# Patient Record
Sex: Female | Born: 1935 | Race: Black or African American | Hispanic: No | Marital: Married | Smoking: Never smoker
Health system: Southern US, Community
[De-identification: ages and names within clinical notes are randomized; demographics above are authoritative.]

## PROBLEM LIST (undated history)

## (undated) DIAGNOSIS — J45909 Unspecified asthma, uncomplicated: Secondary | ICD-10-CM

## (undated) DIAGNOSIS — E785 Hyperlipidemia, unspecified: Secondary | ICD-10-CM

## (undated) DIAGNOSIS — I10 Essential (primary) hypertension: Secondary | ICD-10-CM

## (undated) DIAGNOSIS — I5042 Chronic combined systolic (congestive) and diastolic (congestive) heart failure: Secondary | ICD-10-CM

## (undated) DIAGNOSIS — I214 Non-ST elevation (NSTEMI) myocardial infarction: Secondary | ICD-10-CM

## (undated) DIAGNOSIS — E079 Disorder of thyroid, unspecified: Secondary | ICD-10-CM

## (undated) DIAGNOSIS — I255 Ischemic cardiomyopathy: Secondary | ICD-10-CM

## (undated) DIAGNOSIS — G4700 Insomnia, unspecified: Secondary | ICD-10-CM

## (undated) DIAGNOSIS — I071 Rheumatic tricuspid insufficiency: Secondary | ICD-10-CM

## (undated) HISTORY — PX: TUBAL LIGATION: SHX77

---

## 2009-06-12 ENCOUNTER — Emergency Department (HOSPITAL_BASED_OUTPATIENT_CLINIC_OR_DEPARTMENT_OTHER): Admission: EM | Admit: 2009-06-12 | Discharge: 2009-06-12 | Payer: Self-pay | Admitting: Emergency Medicine

## 2009-06-12 ENCOUNTER — Ambulatory Visit: Payer: Self-pay | Admitting: Diagnostic Radiology

## 2012-10-16 ENCOUNTER — Other Ambulatory Visit: Payer: Self-pay | Admitting: Geriatric Medicine

## 2014-02-02 ENCOUNTER — Emergency Department (HOSPITAL_BASED_OUTPATIENT_CLINIC_OR_DEPARTMENT_OTHER): Payer: Medicare Other

## 2014-02-02 ENCOUNTER — Inpatient Hospital Stay (HOSPITAL_BASED_OUTPATIENT_CLINIC_OR_DEPARTMENT_OTHER)
Admission: EM | Admit: 2014-02-02 | Discharge: 2014-02-10 | DRG: 280 | Disposition: A | Discharge status: Skilled Nursing Facility | Payer: Medicare Other | Attending: Cardiovascular Disease | Admitting: Cardiovascular Disease

## 2014-02-02 ENCOUNTER — Encounter (HOSPITAL_BASED_OUTPATIENT_CLINIC_OR_DEPARTMENT_OTHER): Payer: Self-pay | Admitting: Emergency Medicine

## 2014-02-02 DIAGNOSIS — I472 Ventricular tachycardia: Secondary | ICD-10-CM | POA: Diagnosis not present

## 2014-02-02 DIAGNOSIS — F0390 Unspecified dementia without behavioral disturbance: Secondary | ICD-10-CM | POA: Diagnosis present

## 2014-02-02 DIAGNOSIS — J4489 Other specified chronic obstructive pulmonary disease: Secondary | ICD-10-CM | POA: Diagnosis present

## 2014-02-02 DIAGNOSIS — I509 Heart failure, unspecified: Secondary | ICD-10-CM

## 2014-02-02 DIAGNOSIS — I5021 Acute systolic (congestive) heart failure: Secondary | ICD-10-CM | POA: Diagnosis not present

## 2014-02-02 DIAGNOSIS — E876 Hypokalemia: Secondary | ICD-10-CM | POA: Diagnosis not present

## 2014-02-02 DIAGNOSIS — E785 Hyperlipidemia, unspecified: Secondary | ICD-10-CM | POA: Diagnosis present

## 2014-02-02 DIAGNOSIS — E039 Hypothyroidism, unspecified: Secondary | ICD-10-CM | POA: Diagnosis present

## 2014-02-02 DIAGNOSIS — F0391 Unspecified dementia with behavioral disturbance: Secondary | ICD-10-CM

## 2014-02-02 DIAGNOSIS — I214 Non-ST elevation (NSTEMI) myocardial infarction: Secondary | ICD-10-CM | POA: Diagnosis not present

## 2014-02-02 DIAGNOSIS — R9431 Abnormal electrocardiogram [ECG] [EKG]: Secondary | ICD-10-CM | POA: Diagnosis present

## 2014-02-02 DIAGNOSIS — I5022 Chronic systolic (congestive) heart failure: Secondary | ICD-10-CM | POA: Diagnosis present

## 2014-02-02 DIAGNOSIS — Z66 Do not resuscitate: Secondary | ICD-10-CM | POA: Diagnosis present

## 2014-02-02 DIAGNOSIS — Z8249 Family history of ischemic heart disease and other diseases of the circulatory system: Secondary | ICD-10-CM

## 2014-02-02 DIAGNOSIS — J449 Chronic obstructive pulmonary disease, unspecified: Secondary | ICD-10-CM | POA: Diagnosis present

## 2014-02-02 DIAGNOSIS — N390 Urinary tract infection, site not specified: Secondary | ICD-10-CM | POA: Diagnosis not present

## 2014-02-02 DIAGNOSIS — I10 Essential (primary) hypertension: Secondary | ICD-10-CM | POA: Diagnosis present

## 2014-02-02 DIAGNOSIS — R0602 Shortness of breath: Secondary | ICD-10-CM | POA: Diagnosis not present

## 2014-02-02 HISTORY — DX: Essential (primary) hypertension: I10

## 2014-02-02 HISTORY — DX: Ischemic cardiomyopathy: I25.5

## 2014-02-02 HISTORY — DX: Hyperlipidemia, unspecified: E78.5

## 2014-02-02 HISTORY — DX: Rheumatic tricuspid insufficiency: I07.1

## 2014-02-02 HISTORY — DX: Disorder of thyroid, unspecified: E07.9

## 2014-02-02 HISTORY — DX: Unspecified asthma, uncomplicated: J45.909

## 2014-02-02 HISTORY — DX: Chronic combined systolic (congestive) and diastolic (congestive) heart failure: I50.42

## 2014-02-02 HISTORY — DX: Non-ST elevation (NSTEMI) myocardial infarction: I21.4

## 2014-02-02 HISTORY — DX: Insomnia, unspecified: G47.00

## 2014-02-02 LAB — CBC WITH DIFFERENTIAL/PLATELET
BASOS ABS: 0 10*3/uL (ref 0.0–0.1)
BASOS PCT: 0 % (ref 0–1)
Eosinophils Absolute: 0 10*3/uL (ref 0.0–0.7)
Eosinophils Relative: 0 % (ref 0–5)
HCT: 33.3 % — ABNORMAL LOW (ref 36.0–46.0)
HEMOGLOBIN: 11.1 g/dL — AB (ref 12.0–15.0)
Lymphocytes Relative: 13 % (ref 12–46)
Lymphs Abs: 1.4 10*3/uL (ref 0.7–4.0)
MCH: 28.9 pg (ref 26.0–34.0)
MCHC: 33.3 g/dL (ref 30.0–36.0)
MCV: 86.7 fL (ref 78.0–100.0)
MONOS PCT: 7 % (ref 3–12)
Monocytes Absolute: 0.8 10*3/uL (ref 0.1–1.0)
NEUTROS ABS: 9.1 10*3/uL — AB (ref 1.7–7.7)
NEUTROS PCT: 80 % — AB (ref 43–77)
PLATELETS: 257 10*3/uL (ref 150–400)
RBC: 3.84 MIL/uL — AB (ref 3.87–5.11)
RDW: 14.9 % (ref 11.5–15.5)
WBC: 11.3 10*3/uL — AB (ref 4.0–10.5)

## 2014-02-02 NOTE — ED Provider Notes (Addendum)
CSN: [id number]     Arrival date & time 02/02/14  2223 History  This chart was scribed for [name] Contreras [name] Cords, MD by Gwenyth Ober, ED Scribe. This patient was seen in room MH09/MH09 and the patient's care was started at 11:08 PM.     Chief Complaint  Patient presents with  . Shortness of Breath   Patient is a 80 y.o. female presenting with shortness of breath. The history is provided by the patient, a relative and the spouse. No language interpreter was used.  Shortness of Breath Severity:  Moderate Onset quality:  Gradual Timing:  Intermittent Progression:  Worsening Chronicity:  Recurrent Context: not animal exposure   Relieved by:  Nothing Worsened by:  Nothing tried Ineffective treatments:  None tried Associated symptoms: chest pain   Associated symptoms: no diaphoresis, no fever and no wheezing   Risk factors: no recent surgery    HPI Comments: [name] Contreras is a 80 y.o. female who presents to the Emergency Department complaining of constant global weakness, SOB, and swelling in the legs. She states that pt needed help going to the bathroom and getting out of bed. Patient states the fatigue has been episodic but is worse in the past 10 days with breathing worse in the past 48 hours.  Has used her inhalers without relief. Pt has had a history of similar symptoms in the past, but they come and go regularly. Pt denies being on Coumadin.  Patient reports chest pain from time to time but does not endorse any in the past 24 hours.    PCP is Dr. Julio Contreras  Past Medical History  Diagnosis Date  . Asthma   . Hypertension   . Hyperlipemia   . Insomnia   . Thyroid disease    Past Surgical History  Procedure Laterality Date  . Tubal ligation     No family history on file. History  Substance Use Topics  . Smoking status: Never Smoker   . Smokeless tobacco: Not on file  . Alcohol Use: No   OB History   Grav Para Term Preterm Abortions TAB SAB Ect Mult Living                  Review of Systems  Constitutional: Positive for fatigue. Negative for fever and diaphoresis.  Respiratory: Positive for shortness of breath. Negative for wheezing.   Cardiovascular: Positive for chest pain and leg swelling. Negative for palpitations.  Neurological: Negative for dizziness, weakness and numbness.  All other systems reviewed and are negative.     Allergies  Review of patient's allergies indicates no known allergies.  Home Medications   Prior to Admission medications   Medication Sig Start Date End Date Taking? Authorizing Provider  ALPRAZolam Prudy Feeler) 0.5 MG tablet Take 0.5 mg by mouth at bedtime as needed for anxiety.   Yes Historical Provider, MD  amLODipine (NORVASC) 5 MG tablet Take 5 mg by mouth daily.   Yes Historical Provider, MD  levothyroxine (SYNTHROID, LEVOTHROID) 25 MCG tablet Take 25 mcg by mouth daily before breakfast.   Yes Historical Provider, MD  lisinopril (PRINIVIL,ZESTRIL) 10 MG tablet Take 10 mg by mouth daily.   Yes Historical Provider, MD  simvastatin (ZOCOR) 20 MG tablet Take 20 mg by mouth daily.   Yes Historical Provider, MD   BP 158/110  Pulse 113  Temp(Src) 98.1 F (36.7 C) (Oral)  Resp 20  Ht 5\' 5"  (1.651 m)  Wt 140 lb (63.504 kg)  BMI 23.30 kg/m2  SpO2  99% Physical Exam  Nursing note and vitals reviewed. Constitutional: She appears well-developed and well-nourished.  HENT:  Head: Normocephalic and atraumatic.  Mouth/Throat: Oropharynx is clear and moist. No oropharyngeal exudate.  Trachea midline  Eyes: EOM are normal. Pupils are equal, round, and reactive to light.  Neck: Normal range of motion. Neck supple.  Cardiovascular: Normal rate, regular rhythm, normal heart sounds and intact distal pulses.  Exam reveals no gallop and no friction rub.   No murmur heard. Brawny edema up to knees bilateral, symmetric   Pulmonary/Chest: Effort normal and breath sounds normal. No stridor. No respiratory distress. She has no wheezes. She  has no rales. She exhibits no tenderness.  No stridor, no bruits  Abdominal: Soft. Bowel sounds are normal. There is no tenderness. There is no rebound and no guarding.  Musculoskeletal: Normal range of motion. She exhibits edema. She exhibits no tenderness.  Lymphadenopathy:    She has no cervical adenopathy.  Neurological: She is alert. She has normal reflexes.  Skin: Skin is warm and dry. She is not diaphoretic.  Psychiatric: She has a normal mood and affect.    ED Course  Procedures (including critical care time) DIAGNOSTIC STUDIES: Oxygen Saturation is 99% on RA, normal by my interpretation.    COORDINATION OF CARE: 11:13 PM Discussed treatment plan with pt at bedside and pt agreed to plan.  Labs Review Labs Reviewed - No data to display  Imaging Review No results found.   EKG Interpretation None      MDM   Final diagnoses:  None    NSTEMI   1240 case d/w Dr. Leeann MustJacob kelly of cardiology who accept patient on behalf of Dr. Clifton JamesMcalhany, will admit to step down    Medications  aspirin chewable tablet 324 mg (324 mg Oral Given 02/03/14 0035)  heparin ADULT infusion 100 units/mL (25000 units/250 mL) (12 Units/kg/hr  63.5 kg Intravenous New Bag/Given 02/03/14 0035)    Date: 02/03/2014  Rate: 98  Rhythm: normal sinus rhythm  QRS Axis: normal  Intervals: QT prolonged  ST/T Wave abnormalities: Inverted T waves inferiorly and anteriolaterally consistent with ischemia  Conduction Disutrbances:none  Narrative Interpretation:   Old EKG Reviewed: none available     [name], MD 02/03/14 0047  [name], MD 02/03/14 [phone]

## 2014-02-02 NOTE — ED Notes (Signed)
C/o sob, increased confusion x 3 weeks  Lower ext swollen  Denies pain

## 2014-02-02 NOTE — ED Notes (Signed)
Daughter reports increased weakness and SOB. Increased confusion  x 3 weeks . Pt is out of albuterol neb.

## 2014-02-03 ENCOUNTER — Encounter (HOSPITAL_BASED_OUTPATIENT_CLINIC_OR_DEPARTMENT_OTHER): Payer: Self-pay | Admitting: Radiology

## 2014-02-03 DIAGNOSIS — E876 Hypokalemia: Secondary | ICD-10-CM | POA: Diagnosis not present

## 2014-02-03 DIAGNOSIS — E784 Other hyperlipidemia: Secondary | ICD-10-CM

## 2014-02-03 DIAGNOSIS — I5022 Chronic systolic (congestive) heart failure: Secondary | ICD-10-CM | POA: Diagnosis present

## 2014-02-03 DIAGNOSIS — I472 Ventricular tachycardia: Secondary | ICD-10-CM | POA: Diagnosis not present

## 2014-02-03 DIAGNOSIS — Z66 Do not resuscitate: Secondary | ICD-10-CM | POA: Diagnosis present

## 2014-02-03 DIAGNOSIS — N390 Urinary tract infection, site not specified: Secondary | ICD-10-CM | POA: Diagnosis not present

## 2014-02-03 DIAGNOSIS — I5021 Acute systolic (congestive) heart failure: Secondary | ICD-10-CM | POA: Diagnosis not present

## 2014-02-03 DIAGNOSIS — I10 Essential (primary) hypertension: Secondary | ICD-10-CM

## 2014-02-03 DIAGNOSIS — J449 Chronic obstructive pulmonary disease, unspecified: Secondary | ICD-10-CM | POA: Diagnosis present

## 2014-02-03 DIAGNOSIS — E785 Hyperlipidemia, unspecified: Secondary | ICD-10-CM | POA: Diagnosis present

## 2014-02-03 DIAGNOSIS — F0390 Unspecified dementia without behavioral disturbance: Secondary | ICD-10-CM | POA: Diagnosis present

## 2014-02-03 DIAGNOSIS — E039 Hypothyroidism, unspecified: Secondary | ICD-10-CM | POA: Diagnosis present

## 2014-02-03 DIAGNOSIS — I509 Heart failure, unspecified: Secondary | ICD-10-CM

## 2014-02-03 DIAGNOSIS — R9431 Abnormal electrocardiogram [ECG] [EKG]: Secondary | ICD-10-CM | POA: Diagnosis present

## 2014-02-03 DIAGNOSIS — Z8249 Family history of ischemic heart disease and other diseases of the circulatory system: Secondary | ICD-10-CM | POA: Diagnosis not present

## 2014-02-03 DIAGNOSIS — I214 Non-ST elevation (NSTEMI) myocardial infarction: Principal | ICD-10-CM

## 2014-02-03 DIAGNOSIS — R0602 Shortness of breath: Secondary | ICD-10-CM | POA: Diagnosis present

## 2014-02-03 LAB — LIPID PANEL
CHOL/HDL RATIO: 2.2 ratio
Cholesterol: 166 mg/dL (ref 0–200)
HDL: 76 mg/dL (ref >39)
LDL CALC: 73 mg/dL (ref 0–99)
Triglycerides: 83 mg/dL (ref <150)
VLDL: 17 mg/dL (ref 0–40)

## 2014-02-03 LAB — PROTIME-INR
INR: 1.34 (ref 0.00–1.49)
Prothrombin Time: 16.6 seconds — ABNORMAL HIGH (ref 11.6–15.2)

## 2014-02-03 LAB — COMPREHENSIVE METABOLIC PANEL
ALT: 19 U/L (ref 0–35)
ALT: 21 U/L (ref 0–35)
ANION GAP: 21 — AB (ref 5–15)
AST: 48 U/L — ABNORMAL HIGH (ref 0–37)
AST: 51 U/L — AB (ref 0–37)
Albumin: 3.3 g/dL — ABNORMAL LOW (ref 3.5–5.2)
Albumin: 3.6 g/dL (ref 3.5–5.2)
Alkaline Phosphatase: 143 U/L — ABNORMAL HIGH (ref 39–117)
Alkaline Phosphatase: 152 U/L — ABNORMAL HIGH (ref 39–117)
Anion gap: 18 — ABNORMAL HIGH (ref 5–15)
BILIRUBIN TOTAL: 0.5 mg/dL (ref 0.3–1.2)
BUN: 27 mg/dL — ABNORMAL HIGH (ref 6–23)
BUN: 27 mg/dL — ABNORMAL HIGH (ref 6–23)
CO2: 22 mEq/L (ref 19–32)
CO2: 22 meq/L (ref 19–32)
Calcium: 9.3 mg/dL (ref 8.4–10.5)
Calcium: 9.9 mg/dL (ref 8.4–10.5)
Chloride: 105 mEq/L (ref 96–112)
Chloride: 108 mEq/L (ref 96–112)
Creatinine, Ser: 1.29 mg/dL — ABNORMAL HIGH (ref 0.50–1.10)
Creatinine, Ser: 1.3 mg/dL — ABNORMAL HIGH (ref 0.50–1.10)
GFR calc Af Amer: 43 mL/min — ABNORMAL LOW (ref >90)
GFR calc non Af Amer: 37 mL/min — ABNORMAL LOW (ref >90)
GFR, EST AFRICAN AMERICAN: 42 mL/min — AB (ref >90)
GFR, EST NON AFRICAN AMERICAN: 36 mL/min — AB (ref >90)
GLUCOSE: 147 mg/dL — AB (ref 70–99)
Glucose, Bld: 120 mg/dL — ABNORMAL HIGH (ref 70–99)
Potassium: 3.8 mEq/L (ref 3.7–5.3)
Potassium: 4 mEq/L (ref 3.7–5.3)
SODIUM: 148 meq/L — AB (ref 137–147)
SODIUM: 148 meq/L — AB (ref 137–147)
Total Bilirubin: 0.3 mg/dL (ref 0.3–1.2)
Total Protein: 7.6 g/dL (ref 6.0–8.3)
Total Protein: 8.2 g/dL (ref 6.0–8.3)

## 2014-02-03 LAB — URINALYSIS, ROUTINE W REFLEX MICROSCOPIC
Bilirubin Urine: NEGATIVE
Glucose, UA: NEGATIVE mg/dL
Ketones, ur: 15 mg/dL — AB
NITRITE: NEGATIVE
PH: 5 (ref 5.0–8.0)
Protein, ur: 100 mg/dL — AB
SPECIFIC GRAVITY, URINE: 1.014 (ref 1.005–1.030)
UROBILINOGEN UA: 0.2 mg/dL (ref 0.0–1.0)

## 2014-02-03 LAB — CBC WITH DIFFERENTIAL/PLATELET
Basophils Absolute: 0 10*3/uL (ref 0.0–0.1)
Basophils Relative: 0 % (ref 0–1)
EOS ABS: 0 10*3/uL (ref 0.0–0.7)
EOS PCT: 0 % (ref 0–5)
HCT: 32.7 % — ABNORMAL LOW (ref 36.0–46.0)
HEMOGLOBIN: 10.8 g/dL — AB (ref 12.0–15.0)
Lymphocytes Relative: 17 % (ref 12–46)
Lymphs Abs: 2.1 10*3/uL (ref 0.7–4.0)
MCH: 28.4 pg (ref 26.0–34.0)
MCHC: 33 g/dL (ref 30.0–36.0)
MCV: 86.1 fL (ref 78.0–100.0)
MONOS PCT: 8 % (ref 3–12)
Monocytes Absolute: 1 10*3/uL (ref 0.1–1.0)
Neutro Abs: 9.5 10*3/uL — ABNORMAL HIGH (ref 1.7–7.7)
Neutrophils Relative %: 75 % (ref 43–77)
PLATELETS: 270 10*3/uL (ref 150–400)
RBC: 3.8 MIL/uL — AB (ref 3.87–5.11)
RDW: 15.2 % (ref 11.5–15.5)
WBC: 12.7 10*3/uL — ABNORMAL HIGH (ref 4.0–10.5)

## 2014-02-03 LAB — CBC
HCT: 36.2 % (ref 36.0–46.0)
HEMATOCRIT: 32.8 % — AB (ref 36.0–46.0)
Hemoglobin: 11.4 g/dL — ABNORMAL LOW (ref 12.0–15.0)
Hemoglobin: 10.8 g/dL — ABNORMAL LOW (ref 12.0–15.0)
MCH: 28.1 pg (ref 26.0–34.0)
MCH: 28.1 pg (ref 26.0–34.0)
MCHC: 31.5 g/dL (ref 30.0–36.0)
MCHC: 32.9 g/dL (ref 30.0–36.0)
MCV: 89.4 fL (ref 78.0–100.0)
MCV: 85.4 fL (ref 78.0–100.0)
Platelets: 249 10*3/uL (ref 150–400)
Platelets: 263 10*3/uL (ref 150–400)
RBC: 4.05 MIL/uL (ref 3.87–5.11)
RBC: 3.84 MIL/uL — ABNORMAL LOW (ref 3.87–5.11)
RDW: 15.7 % — AB (ref 11.5–15.5)
RDW: 15.4 % (ref 11.5–15.5)
WBC: 11.5 10*3/uL — ABNORMAL HIGH (ref 4.0–10.5)
WBC: 12.1 10*3/uL — AB (ref 4.0–10.5)

## 2014-02-03 LAB — PRO B NATRIURETIC PEPTIDE
PRO B NATRI PEPTIDE: 34552 pg/mL — AB (ref 0–450)
Pro B Natriuretic peptide (BNP): 33063 pg/mL — ABNORMAL HIGH (ref 0–450)

## 2014-02-03 LAB — PLATELET INHIBITION P2Y12: Platelet Function  P2Y12: 285 [PRU] (ref 194–418)

## 2014-02-03 LAB — BASIC METABOLIC PANEL
Anion gap: 18 — ABNORMAL HIGH (ref 5–15)
BUN: 27 mg/dL — AB (ref 6–23)
CHLORIDE: 104 meq/L (ref 96–112)
CO2: 26 mEq/L (ref 19–32)
Calcium: 9.3 mg/dL (ref 8.4–10.5)
Creatinine, Ser: 1.29 mg/dL — ABNORMAL HIGH (ref 0.50–1.10)
GFR, EST AFRICAN AMERICAN: 43 mL/min — AB (ref >90)
GFR, EST NON AFRICAN AMERICAN: 37 mL/min — AB (ref >90)
Glucose, Bld: 142 mg/dL — ABNORMAL HIGH (ref 70–99)
Potassium: 3.3 mEq/L — ABNORMAL LOW (ref 3.7–5.3)
Sodium: 148 mEq/L — ABNORMAL HIGH (ref 137–147)

## 2014-02-03 LAB — MRSA PCR SCREENING: MRSA BY PCR: NEGATIVE

## 2014-02-03 LAB — HEMOGLOBIN A1C
HEMOGLOBIN A1C: 6 % — AB (ref <5.7)
Mean Plasma Glucose: 126 mg/dL — ABNORMAL HIGH (ref <117)

## 2014-02-03 LAB — TROPONIN I
TROPONIN I: 1.06 ng/mL — AB (ref <0.30)
TROPONIN I: 1.69 ng/mL — AB (ref <0.30)
TROPONIN I: 4.09 ng/mL — AB (ref <0.30)
Troponin I: 2.08 ng/mL (ref <0.30)

## 2014-02-03 LAB — URINE MICROSCOPIC-ADD ON

## 2014-02-03 LAB — HEPARIN LEVEL (UNFRACTIONATED): Heparin Unfractionated: 0.42 IU/mL (ref 0.30–0.70)

## 2014-02-03 LAB — MAGNESIUM: MAGNESIUM: 1.9 mg/dL (ref 1.5–2.5)

## 2014-02-03 LAB — TSH: TSH: 3.29 u[IU]/mL (ref 0.350–4.500)

## 2014-02-03 LAB — I-STAT CG4 LACTIC ACID, ED: LACTIC ACID, VENOUS: 1.78 mmol/L (ref 0.5–2.2)

## 2014-02-03 MED ORDER — POTASSIUM CHLORIDE ER 10 MEQ PO TBCR
40.0000 meq | EXTENDED_RELEASE_TABLET | Freq: Once | ORAL | Status: AC
  Administered 2014-02-03: 23:00:00 10 meq via ORAL
  Filled 2014-02-03: qty 4

## 2014-02-03 MED ORDER — FUROSEMIDE 10 MG/ML IJ SOLN
40.0000 mg | Freq: Once | INTRAMUSCULAR | Status: AC
  Administered 2014-02-03: 40 mg via INTRAVENOUS
  Filled 2014-02-03: qty 4

## 2014-02-03 MED ORDER — SODIUM CHLORIDE 0.9 % IV SOLN: 250.0000 mL | INTRAVENOUS | Status: DC | PRN

## 2014-02-03 MED ORDER — ATORVASTATIN CALCIUM 80 MG PO TABS
80.0000 mg | ORAL_TABLET | Freq: Every day | ORAL | Status: DC
  Administered 2014-02-03 – 2014-02-09 (×6): 80 mg via ORAL
  Filled 2014-02-03 (×9): qty 1

## 2014-02-03 MED ORDER — ACETAMINOPHEN 325 MG PO TABS
650.0000 mg | ORAL_TABLET | ORAL | Status: DC | PRN
  Administered 2014-02-05 – 2014-02-06 (×3): 650 mg via ORAL
  Filled 2014-02-03 (×3): qty 2

## 2014-02-03 MED ORDER — SODIUM CHLORIDE 0.9 % IV SOLN: INTRAVENOUS | Status: DC

## 2014-02-03 MED ORDER — NITROGLYCERIN 0.4 MG SL SUBL
0.4000 mg | SUBLINGUAL_TABLET | SUBLINGUAL | Status: DC | PRN
  Filled 2014-02-03: qty 1

## 2014-02-03 MED ORDER — CETYLPYRIDINIUM CHLORIDE 0.05 % MT LIQD
7.0000 mL | Freq: Two times a day (BID) | OROMUCOSAL | Status: DC
  Administered 2014-02-04 – 2014-02-10 (×11): 7 mL via OROMUCOSAL

## 2014-02-03 MED ORDER — LISINOPRIL 10 MG PO TABS
10.0000 mg | ORAL_TABLET | Freq: Every day | ORAL | Status: DC
  Administered 2014-02-03 – 2014-02-04 (×2): 10 mg via ORAL
  Filled 2014-02-03 (×2): qty 1

## 2014-02-03 MED ORDER — ASPIRIN 81 MG PO CHEW
81.0000 mg | CHEWABLE_TABLET | ORAL | Status: DC
  Filled 2014-02-03: qty 1

## 2014-02-03 MED ORDER — ASPIRIN 300 MG RE SUPP: 300.0000 mg | RECTAL | Status: DC

## 2014-02-03 MED ORDER — ALPRAZOLAM 0.25 MG PO TABS
0.2500 mg | ORAL_TABLET | Freq: Once | ORAL | Status: AC
  Administered 2014-02-05: 0.25 mg via ORAL
  Filled 2014-02-03 (×2): qty 1

## 2014-02-03 MED ORDER — SODIUM CHLORIDE 0.9 % IJ SOLN: 3.0000 mL | INTRAMUSCULAR | Status: DC | PRN

## 2014-02-03 MED ORDER — ASPIRIN EC 81 MG PO TBEC
81.0000 mg | DELAYED_RELEASE_TABLET | Freq: Every day | ORAL | Status: DC
  Administered 2014-02-05 – 2014-02-10 (×6): 81 mg via ORAL
  Filled 2014-02-03 (×6): qty 1

## 2014-02-03 MED ORDER — FUROSEMIDE 10 MG/ML IJ SOLN
40.0000 mg | Freq: Two times a day (BID) | INTRAMUSCULAR | Status: DC
  Administered 2014-02-03 – 2014-02-04 (×2): 40 mg via INTRAVENOUS
  Filled 2014-02-03 (×3): qty 4

## 2014-02-03 MED ORDER — ALBUTEROL SULFATE (2.5 MG/3ML) 0.083% IN NEBU
INHALATION_SOLUTION | RESPIRATORY_TRACT | Status: AC
  Filled 2014-02-03: qty 3

## 2014-02-03 MED ORDER — METOPROLOL TARTRATE 12.5 MG HALF TABLET
12.5000 mg | ORAL_TABLET | Freq: Two times a day (BID) | ORAL | Status: DC
  Administered 2014-02-03 – 2014-02-04 (×4): 12.5 mg via ORAL
  Filled 2014-02-03 (×6): qty 1

## 2014-02-03 MED ORDER — SODIUM CHLORIDE 0.9 % IJ SOLN
3.0000 mL | Freq: Two times a day (BID) | INTRAMUSCULAR | Status: DC
Start: 2014-02-03 — End: 2014-02-03

## 2014-02-03 MED ORDER — ASPIRIN EC 81 MG PO TBEC: 81.0000 mg | DELAYED_RELEASE_TABLET | Freq: Every day | ORAL | Status: DC

## 2014-02-03 MED ORDER — IPRATROPIUM BROMIDE 0.02 % IN SOLN
0.5000 mg | Freq: Four times a day (QID) | RESPIRATORY_TRACT | Status: DC
  Administered 2014-02-03 (×3): 0.5 mg via RESPIRATORY_TRACT
  Filled 2014-02-03 (×2): qty 2.5

## 2014-02-03 MED ORDER — SODIUM CHLORIDE 0.9 % IV SOLN
Freq: Once | INTRAVENOUS | Status: AC
  Administered 2014-02-03: 500 mL via INTRAVENOUS

## 2014-02-03 MED ORDER — HEPARIN (PORCINE) IN NACL 100-0.45 UNIT/ML-% IJ SOLN
950.0000 [IU]/h | INTRAMUSCULAR | Status: DC
  Administered 2014-02-03 – 2014-02-06 (×4): 950 [IU]/h via INTRAVENOUS
  Filled 2014-02-03 (×5): qty 250

## 2014-02-03 MED ORDER — ALBUTEROL SULFATE (2.5 MG/3ML) 0.083% IN NEBU
2.5000 mg | INHALATION_SOLUTION | Freq: Four times a day (QID) | RESPIRATORY_TRACT | Status: DC
  Administered 2014-02-03 (×3): 2.5 mg via RESPIRATORY_TRACT
  Filled 2014-02-03 (×2): qty 3

## 2014-02-03 MED ORDER — LORAZEPAM 2 MG/ML IJ SOLN
0.5000 mg | Freq: Once | INTRAMUSCULAR | Status: AC
  Administered 2014-02-03: 0.5 mg via INTRAVENOUS
  Filled 2014-02-03: qty 1

## 2014-02-03 MED ORDER — SODIUM CHLORIDE 0.9 % IJ SOLN: 3.0000 mL | Freq: Two times a day (BID) | INTRAMUSCULAR | Status: DC

## 2014-02-03 MED ORDER — HEPARIN (PORCINE) IN NACL 100-0.45 UNIT/ML-% IJ SOLN
12.0000 [IU]/kg/h | Freq: Once | INTRAMUSCULAR | Status: AC
  Administered 2014-02-03: 12 [IU]/kg/h via INTRAVENOUS
  Filled 2014-02-03: qty 250

## 2014-02-03 MED ORDER — HEPARIN (PORCINE) IN NACL 100-0.45 UNIT/ML-% IJ SOLN
750.0000 [IU]/h | Freq: Once | INTRAMUSCULAR | Status: DC
  Filled 2014-02-03: qty 250

## 2014-02-03 MED ORDER — LEVOTHYROXINE SODIUM 25 MCG PO TABS
25.0000 ug | ORAL_TABLET | Freq: Every day | ORAL | Status: DC
  Administered 2014-02-03 – 2014-02-10 (×8): 25 ug via ORAL
  Filled 2014-02-03 (×9): qty 1

## 2014-02-03 MED ORDER — IPRATROPIUM BROMIDE 0.02 % IN SOLN
RESPIRATORY_TRACT | Status: AC
  Filled 2014-02-03: qty 2.5

## 2014-02-03 MED ORDER — ONDANSETRON HCL 4 MG/2ML IJ SOLN
4.0000 mg | Freq: Four times a day (QID) | INTRAMUSCULAR | Status: DC | PRN
  Administered 2014-02-08: 4 mg via INTRAVENOUS
  Filled 2014-02-03: qty 2

## 2014-02-03 MED ORDER — ALPRAZOLAM 0.5 MG PO TABS
0.5000 mg | ORAL_TABLET | Freq: Every evening | ORAL | Status: DC | PRN
  Administered 2014-02-03 – 2014-02-05 (×2): 0.5 mg via ORAL
  Filled 2014-02-03 (×2): qty 1

## 2014-02-03 MED ORDER — ASPIRIN 81 MG PO CHEW
324.0000 mg | CHEWABLE_TABLET | Freq: Once | ORAL | Status: AC
  Administered 2014-02-03: 324 mg via ORAL
  Filled 2014-02-03: qty 4

## 2014-02-03 MED ORDER — ASPIRIN 81 MG PO CHEW: 324.0000 mg | CHEWABLE_TABLET | ORAL | Status: DC

## 2014-02-03 MED ORDER — AMLODIPINE BESYLATE 5 MG PO TABS
5.0000 mg | ORAL_TABLET | Freq: Every day | ORAL | Status: DC
  Administered 2014-02-03 – 2014-02-09 (×7): 5 mg via ORAL
  Filled 2014-02-03 (×8): qty 1

## 2014-02-03 NOTE — Progress Notes (Signed)
CRITICAL VALUE ALERT  Critical value received:  Troponin 1.69  Date of notification:  02/03/14  Time of notification:  1000  Critical value read back:Yes.    Nurse who received alert:  Donna ChristenJim Arta Stump, RN  MD notified (1st page):  Wilburt FinlayHager, Bryan, PA  Time of first page:  1000; verbal notification  MD notified (2nd page):  Time of second page:  Responding MD:  Wilburt FinlayHager, Bryan, PA; verbal acknowledgement  Time MD responded:  1000

## 2014-02-03 NOTE — Progress Notes (Signed)
ANTICOAGULATION CONSULT NOTE - Follow Up Consult  Pharmacy Consult for heparin Indication: NSTEMI  No Known Allergies  Patient Measurements: Height: 5\' 5"  (165.1 cm) Weight: 156 lb 1.4 oz (70.8 kg) IBW/kg (Calculated) : 57  Vital Signs: Temp: 97.8 F (36.6 C) (10/01 0233) Temp src: Oral (10/01 0233) BP: 155/110 mmHg (10/01 0233) Pulse Rate: 103 (10/01 0233)  Labs:  Recent Labs  02/02/14 2330 02/03/14 0345 02/03/14 0954  HGB 11.1* 10.8* 11.4*  HCT 33.3* 32.7* 36.2  PLT 257 270 249  LABPROT  --  16.6*  --   INR  --  1.34  --   HEPARINUNFRC  --   --  <0.10*  CREATININE 1.30* 1.29*  --   TROPONINI 4.09*  --  1.69*    Estimated Creatinine Clearance: 31.5 ml/min (by C-G formula based on Cr of 1.29).   Medications:  Prescriptions prior to admission  Medication Sig Dispense Refill  . ALPRAZolam (XANAX) 0.5 MG tablet Take 0.5 mg by mouth at bedtime as needed for anxiety.      Marland Kitchen. amLODipine (NORVASC) 5 MG tablet Take 5 mg by mouth daily.      Marland Kitchen. aspirin EC 81 MG tablet Take 81 mg by mouth daily.      Marland Kitchen. ipratropium-albuterol (DUONEB) 0.5-2.5 (3) MG/3ML SOLN Take 3 mLs by nebulization every 6 (six) hours as needed.      Marland Kitchen. levothyroxine (SYNTHROID, LEVOTHROID) 25 MCG tablet Take 25 mcg by mouth daily before breakfast.      . lisinopril (PRINIVIL,ZESTRIL) 10 MG tablet Take 10 mg by mouth daily.      Marland Kitchen. PROAIR HFA 108 (90 BASE) MCG/ACT inhaler Inhale 2 puffs into the lungs every 4 (four) hours as needed.      . simvastatin (ZOCOR) 20 MG tablet Take 20 mg by mouth daily.       Scheduled:  . albuterol  2.5 mg Nebulization Q6H  . albuterol      . amLODipine  5 mg Oral Daily  . [START ON 02/04/2014] aspirin EC  81 mg Oral Daily  . atorvastatin  80 mg Oral q1800  . ipratropium      . ipratropium  0.5 mg Nebulization Q6H  . levothyroxine  25 mcg Oral QAC breakfast  . lisinopril  10 mg Oral Daily  . metoprolol tartrate  12.5 mg Oral BID  . sodium chloride  3 mL Intravenous Q12H     Assessment: 80[facility] and SOB, found with elevated troponin. Continue on IV heparin, heparin level < 0.1 this morning. Cbc stable, no issues with infusion per RN. Likely cath tomorrow  Goal of Therapy:  Heparin level 0.3-0.7 units/ml Monitor platelets by anticoagulation protocol: Yes   Plan:  Increased heparin infusion to 950 units/hr F/u heparin level at 1930 Daily heparin and CBC  Bayard HuggerMei Morena Mckissack, PharmD, BCPS  Clinical Pharmacist  Pager: [phone]  02/03/2014,1:08 PM

## 2014-02-03 NOTE — Progress Notes (Signed)
Tele shows irregular rhythm, alternates between SR and possibly a-fib.  Already on heparin gtt IV. Becomes more irregular and HR up to 140 briefly [facility]  EKG done, shows SR w/ premature supraventricular complexes and long QTc which MD is aware of according to progress notes.  Very weak, requires 2 assist to use BSC but refuses bedpan. Pt restless, confused and easily agitated.  Reportedly much calmer when family is here but all have left for the night.  Safety sitter remains in room.  Will continue to monitor.

## 2014-02-03 NOTE — Progress Notes (Signed)
ANTICOAGULATION CONSULT NOTE - Follow Up Consult  Pharmacy Consult for heparin Indication: NSTEMI  No Known Allergies  Patient Measurements: Height: 5\' 5"  (165.1 cm) Weight: 156 lb 1.4 oz (70.8 kg) IBW/kg (Calculated) : 57  Vital Signs: Temp: 97.8 F (36.6 C) (10/01 0233) Temp src: Oral (10/01 0233) BP: 155/110 mmHg (10/01 0233) Pulse Rate: 103 (10/01 0233)  Labs:  Recent Labs  02/02/14 2330  HGB 11.1*  HCT 33.3*  PLT 257  CREATININE 1.30*  TROPONINI 4.09*    Estimated Creatinine Clearance: 31.2 ml/min (by C-G formula based on Cr of 1.3).   Medications:  Prescriptions prior to admission  Medication Sig Dispense Refill  . ALPRAZolam (XANAX) 0.5 MG tablet Take 0.5 mg by mouth at bedtime as needed for anxiety.      Marland Kitchen. amLODipine (NORVASC) 5 MG tablet Take 5 mg by mouth daily.      Marland Kitchen. levothyroxine (SYNTHROID, LEVOTHROID) 25 MCG tablet Take 25 mcg by mouth daily before breakfast.      . lisinopril (PRINIVIL,ZESTRIL) 10 MG tablet Take 10 mg by mouth daily.      . simvastatin (ZOCOR) 20 MG tablet Take 20 mg by mouth daily.       Scheduled:  . amLODipine  5 mg Oral Daily  . [COMPLETED] aspirin  324 mg Oral NOW   Or  . [COMPLETED] aspirin  300 mg Rectal NOW  . [START ON 02/04/2014] aspirin EC  81 mg Oral Daily  . atorvastatin  80 mg Oral q1800  . heparin  750 Units/hr Intravenous Once  . levothyroxine  25 mcg Oral QAC breakfast  . lisinopril  10 mg Oral Daily  . metoprolol tartrate  12.5 mg Oral BID  . sodium chloride  3 mL Intravenous Q12H    Assessment: 80[facility] and [facility], found with elevated troponin, to continue heparin begun for NSTEMI.  Goal of Therapy:  Heparin level 0.3-0.7 units/ml Monitor platelets by anticoagulation protocol: Yes   Plan:  Heparin gtt started by EDP at 750 units/hr; will continue for now and monitor heparin levels and CBC.  Vernard GamblesVeronda Mirren Gest, PharmD, BCPS  02/03/2014,2:40 AM

## 2014-02-03 NOTE — Care Management Note (Addendum)
    Page 1 of 2   02/08/2014     2:08:32 PM CARE MANAGEMENT NOTE 02/08/2014  Patient:  [name] Contreras,[name] Contreras   Account Number:  [id number]  Date Initiated:  02/03/2014  Documentation initiated by:  [facility]Fionn Stracke  Subjective/Objective Assessment:   80 yo woman with PMH of HTN, asthma, dyslipidemia and hypothyroidism who presents to [facility] with weakness, fatigue, SOB, gradual development of lower extremity swelling.//Home with spouse.     Action/Plan:   heparin gtt, daily asa 81 mg  - telemetry, NPO, trend cardiac biomarkers  - potential LHC in AM   Anticipated DC Date:  02/07/2014   Anticipated DC Plan:  SKILLED NURSING FACILITY  In-house referral  Clinical Social Worker      DC Planning Services  CM consult      Choice offered to / List presented to:             Status of service:  Completed, signed off Medicare Important Message given?  YES (If response is "NO", the following Medicare IM given date fields will be blank) Date Medicare IM given:  02/07/2014 Medicare IM given by:  [name] Date Additional Medicare IM given:   Additional Medicare IM given by:    Discharge Disposition:    Per UR Regulation:  Reviewed for med. necessity/level of care/duration of stay  If discussed at Long Length of Stay Meetings, dates discussed:    Comments:  CM Referral: Daughter request home health or SNF please verify coverage may call daughter at [name] [phone] Reason for consult:->Home health needs CM, Jalene Demo Lucretia RoersWood notified and SW, Lovette ClicheDonna Crowder Crystal Hutchinson RN, BSN, MSHL, CCM  Nurse - Case Manager,  ([facility])  [phone]  02/07/2014    02/07/14 1000 Oletta Cohnamellia Joclynn Lumb, RN, BSN, NCM [phone] I/O negative 2.4L yesterday, foley placed. Continue IV Lasix one more day. Increase KDur to BID with extra 20 meq this afternoon. Dr Rennis GoldenHilty had a long conversation with family yesterday about DNR status. She will need SNF for rehab at discharge.

## 2014-02-03 NOTE — Progress Notes (Signed)
Subjective: Agitated.  No CP +SoB  Objective: Vital signs in last 24 hours: Temp:  [97.8 F (36.6 C)-98.1 F (36.7 C)] 97.8 F (36.6 C) (10/01 0233) Pulse Rate:  [102-113] 103 (10/01 0233) Resp:  [16-20] 16 (10/01 0233) BP: (151-158)/(97-110) 155/110 mmHg (10/01 0233) SpO2:  [99 %-100 %] 100 % (10/01 0233) Weight:  [140 lb (63.504 kg)-156 lb 1.4 oz (70.8 kg)] 156 lb 1.4 oz (70.8 kg) (10/01 0233)    Intake/Output from previous day: 09/30 0701 - 10/01 0700 In: 70 [I.V.:70] Out: -  Intake/Output this shift:    Medications Current Facility-Administered Medications  Medication Dose Route Frequency Provider Last Rate Last Dose  . 0.9 %  sodium chloride infusion  250 mL Intravenous PRN Leeann Must, MD 10 mL/hr at 02/03/14 0600 250 mL at 02/03/14 0600  . acetaminophen (TYLENOL) tablet 650 mg  650 mg Oral Q4H PRN Leeann Must, MD      . ALPRAZolam Prudy Feeler) tablet 0.5 mg  0.5 mg Oral QHS PRN Leeann Must, MD      . amLODipine (NORVASC) tablet 5 mg  5 mg Oral Daily Leeann Must, MD      . Melene Muller ON 02/04/2014] aspirin EC tablet 81 mg  81 mg Oral Daily Leeann Must, MD      . atorvastatin (LIPITOR) tablet 80 mg  80 mg Oral q1800 Leeann Must, MD      . heparin ADULT infusion 100 units/mL (25000 units/250 mL)  750 Units/hr Intravenous Once Colleen Can, RPH 7.5 mL/hr at 02/03/14 0600 750 Units/hr at 02/03/14 0600  . levothyroxine (SYNTHROID, LEVOTHROID) tablet 25 mcg  25 mcg Oral QAC breakfast Leeann Must, MD   25 mcg at 02/03/14 0981  . lisinopril (PRINIVIL,ZESTRIL) tablet 10 mg  10 mg Oral Daily Leeann Must, MD      . metoprolol tartrate (LOPRESSOR) tablet 12.5 mg  12.5 mg Oral BID Leeann Must, MD      . nitroGLYCERIN (NITROSTAT) SL tablet 0.4 mg  0.4 mg Sublingual Q5 Min x 3 PRN Leeann Must, MD      . ondansetron [facility]) injection 4 mg  4 mg Intravenous Q6H PRN Leeann Must, MD      . sodium chloride 0.9 % injection 3 mL  3 mL Intravenous Q12H Leeann Must, MD      . sodium  chloride 0.9 % injection 3 mL  3 mL Intravenous PRN Leeann Must, MD        PE: General appearance: alert, no distress and uncooperative Lungs: Bilateral rales Heart: regular rate and rhythm, S1, S2 normal, no murmur, click, rub or gallop Abdomen: +BS, nontender Extremities: 1+ LEE Pulses: 2+ and symmetric Skin: Warm and dry Neurologic: Not oriented.  Agitated.    Lab Results:   Recent Labs  02/02/14 2330 02/03/14 0345  WBC 11.3* 12.7*  HGB 11.1* 10.8*  HCT 33.3* 32.7*  PLT 257 270   BMET  Recent Labs  02/02/14 2330 02/03/14 0345  NA 148* 148*  K 3.8 4.0  CL 105 108  CO2 22 22  GLUCOSE 147* 120*  BUN 27* 27*  CREATININE 1.30* 1.29*  CALCIUM 9.9 9.3   PT/INR  Recent Labs  02/03/14 0345  LABPROT 16.6*  INR 1.34   Cholesterol  Recent Labs  02/03/14 0345  CHOL 166   Lipid Panel     Component Value Date/Time   CHOL 166 02/03/2014 0345   TRIG 83 02/03/2014 0345   HDL 76 02/03/2014 0345   CHOLHDL  2.2 02/03/2014 0345   VLDL 17 02/03/2014 0345   LDLCALC 73 02/03/2014 0345    Cardiac Panel (last 3 results)  Recent Labs  02/02/14 2330  TROPONINI 4.09*      Assessment/Plan   Principal Problem:   Acute heart failure Lasix given times 1.  She still has LEE and rales on exam.  Could be atelectasis?   Emphysema on CXR but no acute cardiopulmonary issues.  Slightly blunted CP angles?   Active Problems:   NSTEMI (non-ST elevated myocardial infarction) Initial troponin 4.09.  Continue to trend.  BB, IV heparin and high dose statin.  Echo pending.  At this time I do not think she is a cath candidate as she is mildly agitated.  Assess echo when done.    Hypertension  BP elevated.  ACE, lopressor to be given.    Hypothyroidism  Synthroid   Dyslipidemia  Statin.  Lipids look great   Dementia  Sitter oprdered   Prolonged QT interval  Repeat EKG.  Stat mag ordered.           LOS: 1 day    Starlyn Droge PA-C 02/03/2014 7:10 AM

## 2014-02-03 NOTE — Progress Notes (Signed)
ANTICOAGULATION CONSULT NOTE - Follow Up Consult  Pharmacy Consult for heparin Indication: NSTEMI  No Known Allergies  Patient Measurements: Height: 5\' 5"  (165.1 cm) Weight: 156 lb 1.4 oz (70.8 kg) IBW/kg (Calculated) : 57  Vital Signs: Temp: 97.7 F (36.5 C) (10/01 1700) Temp Source: Oral (10/01 1950) BP: 132/46 mmHg (10/01 1950) Pulse Rate: 45 (10/01 1950)  Labs:  Recent Labs  02/02/14 2330 02/03/14 0345 02/03/14 0954 02/03/14 1419 02/03/14 2000  HGB 11.1* 10.8* 11.4*  --  10.8*  HCT 33.3* 32.7* 36.2  --  32.8*  PLT 257 270 249  --  263  LABPROT  --  16.6*  --   --   --   INR  --  1.34  --   --   --   HEPARINUNFRC  --   --  <0.10*  --  0.42  CREATININE 1.30* 1.29*  --   --  1.29*  TROPONINI 4.09*  --  1.69* 1.06* 2.08*    Estimated Creatinine Clearance: 31.5 ml/min (by C-G formula based on Cr of 1.29).   Medications:  Prescriptions prior to admission  Medication Sig Dispense Refill  . ALPRAZolam (XANAX) 0.5 MG tablet Take 0.5 mg by mouth at bedtime as needed for anxiety.      Marland Kitchen. amLODipine (NORVASC) 5 MG tablet Take 5 mg by mouth daily.      Marland Kitchen. aspirin EC 81 MG tablet Take 81 mg by mouth daily.      Marland Kitchen. ipratropium-albuterol (DUONEB) 0.5-2.5 (3) MG/3ML SOLN Take 3 mLs by nebulization every 6 (six) hours as needed.      Marland Kitchen. levothyroxine (SYNTHROID, LEVOTHROID) 25 MCG tablet Take 25 mcg by mouth daily before breakfast.      . lisinopril (PRINIVIL,ZESTRIL) 10 MG tablet Take 10 mg by mouth daily.      Marland Kitchen. PROAIR HFA 108 (90 BASE) MCG/ACT inhaler Inhale 2 puffs into the lungs every 4 (four) hours as needed.      . simvastatin (ZOCOR) 20 MG tablet Take 20 mg by mouth daily.       Scheduled:  . albuterol  2.5 mg Nebulization Q6H  . albuterol      . ALPRAZolam  0.25 mg Oral Once  . amLODipine  5 mg Oral Daily  . [START ON 02/04/2014] aspirin  81 mg Oral Pre-Cath  . [START ON 02/05/2014] aspirin EC  81 mg Oral Daily  . atorvastatin  80 mg Oral q1800  . furosemide  40 mg  Intravenous BID  . ipratropium      . ipratropium  0.5 mg Nebulization Q6H  . levothyroxine  25 mcg Oral QAC breakfast  . lisinopril  10 mg Oral Daily  . metoprolol tartrate  12.5 mg Oral BID    Assessment: 80[facility] and SOB, found with elevated troponin. Continue on IV heparin, heparin level < 0.1 this morning. Cbc stable, no issues with infusion per RN. Likely cath tomorrow  PM HL therapeutic  Goal of Therapy: Heparin level 0.3-0.7 units/ml Monitor platelets by anticoagulation protocol: Yes   Plan:  Continue heparin infusion at 950 units/hr Daily heparin and CBC  Thank you Okey RegalLisa Latonya Nelon, PharmD  02/03/2014,8:59 PM

## 2014-02-03 NOTE — H&P (Addendum)
Jillana Selph is an 80 y.o. female.     Chief Complaint: shortness of breath Primary Physician:  Dr. Vista Lawman HPI: Ms. Beonca Gibb is an 80 yo woman with PMH of hypertension, asthma, dyslipidemia and hypothyroidism who presents to [facility] with weakness, fatigue, shortness of breath, gradual development of lower extremity swelling worse today but gradual symptoms over the past 10 days to 2 weeks. She has been using inhalers for her asthma with minimal improvement finally leading to presentation. She is accompanied by her husband and family. She was noted to have abnormal ECG with ST depressions and elevated troponin concerning for NSTEMI leading to transfer to [facility]. Previous chest pain but none currently or in the previously 24-48 hours. Long discussion with family. She has early dementia. It sounds like some symptoms have been going on for about 2 weeks and she's had other symptoms (swelling and "wheezing/inhaler use") for some time. We discussed medical therapy and echocardiogram and consideration of cardiac cath based on findings.      Past Medical History  Diagnosis Date  . Asthma   . Hypertension   . Hyperlipemia   . Insomnia   . Thyroid disease     Past Surgical History  Procedure Laterality Date  . Tubal ligation      History reviewed. No pertinent family history. Social History:  reports that she has never smoked. She does not have any smokeless tobacco history on file. She reports that she does not drink alcohol or use illicit drugs. Family history: + CAD in father  Allergies: No Known Allergies   (Not in a hospital admission)  Results for orders placed during the hospital encounter of 02/02/14 (from the past 48 hour(s))  PRO B NATRIURETIC PEPTIDE     Status: Abnormal   Collection Time    02/02/14 11:30 PM      Result Value Ref Range   Pro B Natriuretic peptide (BNP) 34552.0 (*) 0 - 450 pg/mL   Comment: RESULT CONFIRMED BY AUTOMATED DILUTION    CBC WITH DIFFERENTIAL     Status: Abnormal   Collection Time    02/02/14 11:30 PM      Result Value Ref Range   WBC 11.3 (*) 4.0 - 10.5 K/uL   RBC 3.84 (*) 3.87 - 5.11 MIL/uL   Hemoglobin 11.1 (*) 12.0 - 15.0 g/dL   HCT 33.3 (*) 36.0 - 46.0 %   MCV 86.7  78.0 - 100.0 fL   MCH 28.9  26.0 - 34.0 pg   MCHC 33.3  30.0 - 36.0 g/dL   RDW 14.9  11.5 - 15.5 %   Platelets 257  150 - 400 K/uL   Neutrophils Relative % 80 (*) 43 - 77 %   Neutro Abs 9.1 (*) 1.7 - 7.7 K/uL   Lymphocytes Relative 13  12 - 46 %   Lymphs Abs 1.4  0.7 - 4.0 K/uL   Monocytes Relative 7  3 - 12 %   Monocytes Absolute 0.8  0.1 - 1.0 K/uL   Eosinophils Relative 0  0 - 5 %   Eosinophils Absolute 0.0  0.0 - 0.7 K/uL   Basophils Relative 0  0 - 1 %   Basophils Absolute 0.0  0.0 - 0.1 K/uL  COMPREHENSIVE METABOLIC PANEL     Status: Abnormal   Collection Time    02/02/14 11:30 PM      Result Value Ref Range   Sodium 148 (*) 137 - 147 mEq/L  Potassium 3.8  3.7 - 5.3 mEq/L   Chloride 105  96 - 112 mEq/L   CO2 22  19 - 32 mEq/L   Glucose, Bld 147 (*) 70 - 99 mg/dL   BUN 27 (*) 6 - 23 mg/dL   Creatinine, Ser 1.30 (*) 0.50 - 1.10 mg/dL   Calcium 9.9  8.4 - 10.5 mg/dL   Total Protein 8.2  6.0 - 8.3 g/dL   Albumin 3.6  3.5 - 5.2 g/dL   AST 51 (*) 0 - 37 U/L   ALT 21  0 - 35 U/L   Alkaline Phosphatase 152 (*) 39 - 117 U/L   Total Bilirubin 0.5  0.3 - 1.2 mg/dL   GFR calc non Af Amer 36 (*) >90 mL/min   GFR calc Af Amer 42 (*) >90 mL/min   Comment: (NOTE)     The eGFR has been calculated using the CKD EPI equation.     This calculation has not been validated in all clinical situations.     eGFR's persistently <90 mL/min signify possible Chronic Kidney     Disease.   Anion gap 21 (*) 5 - 15  TROPONIN I     Status: Abnormal   Collection Time    02/02/14 11:30 PM      Result Value Ref Range   Troponin I 4.09 (*) <0.30 ng/mL   Comment:            Due to the release kinetics of cTnI,     a negative result within  the first hours     of the onset of symptoms does not rule out     myocardial infarction with certainty.     If myocardial infarction is still suspected,     repeat the test at appropriate intervals.     CRITICAL RESULT CALLED TO, READ BACK BY AND VERIFIED WITH:     BENNETT,S AT 0015 ON [id number] BY CHERESNOWSKY,T  I-STAT CG4 LACTIC ACID, ED     Status: None   Collection Time    02/03/14 12:32 AM      Result Value Ref Range   Lactic Acid, Venous 1.78  0.5 - 2.2 mmol/L   Dg Chest 2 View  02/02/2014   CLINICAL DATA:  Shortness of breath. Increased confusion over 3 weeks. Swelling of the lower extremities.  EXAM: CHEST  2 VIEW  COMPARISON:  None.  FINDINGS: Borderline heart size with normal pulmonary vascularity. Calcified and tortuous aorta. Hyperinflation of the lungs with scattered fibrosis consistent with emphysematous change. No focal airspace consolidation or edema. No blunting of costophrenic angles. No pneumothorax. Degenerative changes in the spine and shoulders.  IMPRESSION: Emphysematous changes in the lungs. No evidence of active pulmonary disease.   Electronically Signed   By: Lucienne Capers M.D.   On: 02/02/2014 23:19   Ct Head Wo Contrast  02/03/2014   CLINICAL DATA:  Confused  EXAM: CT HEAD WITHOUT CONTRAST  TECHNIQUE: Contiguous axial images were obtained from the base of the skull through the vertex without intravenous contrast.  COMPARISON:  None.  FINDINGS: No evidence of parenchymal hemorrhage or extra-axial fluid collection. No mass lesion, mass effect, or midline shift.  No CT evidence of acute infarction.  Subcortical white matter and periventricular small vessel ischemic changes. Mild intracranial atherosclerosis.  Age related atrophy.  No ventriculomegaly.  The visualized paranasal sinuses are essentially clear. The mastoid air cells are unopacified.  No evidence of calvarial fracture.  IMPRESSION: No evidence of acute  intracranial abnormality.  Atrophy with small vessel  ischemic changes and intracranial atherosclerosis.   Electronically Signed   By: Julian Hy M.D.   On: 02/03/2014 00:30    Review of Systems  Constitutional: Positive for malaise/fatigue. Negative for fever and chills.  HENT: Positive for hearing loss. Negative for nosebleeds and tinnitus.   Eyes: Negative for double vision and discharge.  Respiratory: Positive for shortness of breath and wheezing. Negative for hemoptysis.   Cardiovascular: Positive for orthopnea and leg swelling. Negative for chest pain.  Gastrointestinal: Negative for vomiting, abdominal pain and diarrhea.  Musculoskeletal: Negative for back pain and myalgias.  Skin: Negative for rash.  Neurological: Negative for tingling and tremors.  Endo/Heme/Allergies: Negative for polydipsia.  Psychiatric/Behavioral: Negative for substance abuse.    Blood pressure 158/110, pulse 113, temperature 98.1 F (36.7 C), temperature source Oral, resp. rate 20, height 5' 5"  (1.651 m), weight 63.504 kg (140 lb), SpO2 99.00%. Physical Exam  Nursing note and vitals reviewed. Constitutional: She appears well-developed and well-nourished. No distress.  HENT:  Head: Normocephalic and atraumatic.  Nose: Nose normal.  Mouth/Throat: Oropharynx is clear and moist. No oropharyngeal exudate.  Eyes: Conjunctivae and EOM are normal. Pupils are equal, round, and reactive to light. No scleral icterus.  Neck: Normal range of motion. Neck supple. JVD present. No tracheal deviation present.  Cardiovascular: Regular rhythm and intact distal pulses.   Murmur heard. Systolic murmur at lSB, Mildly tachycardic  Respiratory: Effort normal. She has wheezes. She has rales.  Scattered rales at bases and occasional wheezes  GI: Soft. Bowel sounds are normal. She exhibits no distension. There is no tenderness.  Musculoskeletal: Normal range of motion. She exhibits edema.  Neurological: She is alert. No cranial nerve deficit.  Oriented to person  Skin:  Skin is warm and dry. No rash noted. She is not diaphoretic. No erythema.    Labs reviewed; Trop 4.1, cr 1.3, K 3.8, ProBNP 34k CTH unrevealing ECG: anterior infarct, Prolonged QT, AL/inferior ischemia, HR 100, SR Chest x-ray: increased vascularity  Assessment/Plan Ms. Solymar Grace is an 80 yo woman with PMH of hypertension, asthma, dyslipidemia and hypothyroidism who presents to [facility] with weakness, fatigue, shortness of breath, gradual development of lower extremity swelling worse today but gradual symptoms over the past 10 days to 2 weeks. Differential is type I vs. Type II NSTEMI, which could be secondary to heart failure among other etiologies. JVP elevated and lower extremity edema consistent with heart failure as well. Unsure how long-standing. Will trial initial dose of IV lasix and reassess.   1. NSTEMI: elevated troponin with ischemia on ECG. On heparin gtt. NPO for potential LHC. Obtain echocardiogram first to help assess chronicity. Discussed with family and patient (to some degree) about plan. She's currently NPO.  - heparin gtt, daily asa 81 mg - telemetry, NPO, trend cardiac biomarkers - potential LHC in AM - hemoglobin a1c, lipid panel, Mg, TSH 2. Elevated BNP: heart failure could be ischemic or nonischemic in etiology but myocardial injury with elevated troponin. Will gently diuresis and assess etiology.  - IV lasix 40 mg given early morning, reassess response and exam - echocardiogram, TSH 3. Hypertension:  - continue home lisinopril 10 mg, amlodipine 5 mg  - will start metoprolol 12.5 mg bid to assess for hypertension and for MI and HF 4. Dyslipidemia: atorvastatin 80 mg qHS, first dose now 5. Hyopthyroidism: continue synthroid 25 mcg 6. Mildly elevated wbc: obtain urinalysis 7. Dementia: follow for now, may  consider neurology consult  8. Prolonged QT: no overt medications. Recheck ECG.   Andrienne Havener 02/03/2014, 1:00 AM

## 2014-02-03 NOTE — Progress Notes (Signed)
Patient seen and personally examined by myself and chart reviewed.  Agree with note as outlined by Wilburt FinlayBryan Hager PA-C.  She still appears volume overloaded with LE edema and crackles on exam.  2D echo has not been done yet.  Enzyme trend is consistent with NSTEMI and Troponin now trending downward.  She is too agitated today to undergo and cath and from respiratory standpoint is not able to lay completely flat.  Will continue IV diuretics.  Make NPO after MN tonight for cath in am if agitation improves.  Continue ASA/statin/IV Heparin gtt/ACE I/BB.  Increase lasix to 40mg  IV IBD.  Follow renal function and potassium closely with diuresis.

## 2014-02-04 ENCOUNTER — Encounter (HOSPITAL_COMMUNITY)
Admission: EM | Disposition: A | Discharge status: Skilled Nursing Facility | Payer: Self-pay | Source: Home / Self Care | Attending: Cardiovascular Disease

## 2014-02-04 DIAGNOSIS — I369 Nonrheumatic tricuspid valve disorder, unspecified: Secondary | ICD-10-CM

## 2014-02-04 LAB — URINALYSIS, ROUTINE W REFLEX MICROSCOPIC
Bilirubin Urine: NEGATIVE
Glucose, UA: NEGATIVE mg/dL
HGB URINE DIPSTICK: NEGATIVE
Ketones, ur: NEGATIVE mg/dL
Nitrite: NEGATIVE
PH: 5 (ref 5.0–8.0)
Protein, ur: NEGATIVE mg/dL
Specific Gravity, Urine: 1.009 (ref 1.005–1.030)
Urobilinogen, UA: 0.2 mg/dL (ref 0.0–1.0)

## 2014-02-04 LAB — BASIC METABOLIC PANEL
Anion gap: 15 (ref 5–15)
BUN: 25 mg/dL — ABNORMAL HIGH (ref 6–23)
CO2: 26 meq/L (ref 19–32)
CREATININE: 1.15 mg/dL — AB (ref 0.50–1.10)
Calcium: 8.6 mg/dL (ref 8.4–10.5)
Chloride: 103 mEq/L (ref 96–112)
GFR calc Af Amer: 49 mL/min — ABNORMAL LOW (ref >90)
GFR calc non Af Amer: 42 mL/min — ABNORMAL LOW (ref >90)
Glucose, Bld: 92 mg/dL (ref 70–99)
Potassium: 3.7 mEq/L (ref 3.7–5.3)
Sodium: 144 mEq/L (ref 137–147)

## 2014-02-04 LAB — CBC
HCT: 32.1 % — ABNORMAL LOW (ref 36.0–46.0)
HEMOGLOBIN: 10.6 g/dL — AB (ref 12.0–15.0)
MCH: 28.4 pg (ref 26.0–34.0)
MCHC: 33 g/dL (ref 30.0–36.0)
MCV: 86.1 fL (ref 78.0–100.0)
PLATELETS: 202 10*3/uL (ref 150–400)
RBC: 3.73 MIL/uL — AB (ref 3.87–5.11)
RDW: 15.4 % (ref 11.5–15.5)
WBC: 10 10*3/uL (ref 4.0–10.5)

## 2014-02-04 LAB — PROTIME-INR
INR: 1.32 (ref 0.00–1.49)
Prothrombin Time: 16.4 seconds — ABNORMAL HIGH (ref 11.6–15.2)

## 2014-02-04 LAB — URINE MICROSCOPIC-ADD ON

## 2014-02-04 LAB — HEPARIN LEVEL (UNFRACTIONATED): Heparin Unfractionated: 0.43 IU/mL (ref 0.30–0.70)

## 2014-02-04 SURGERY — LEFT HEART CATHETERIZATION WITH CORONARY ANGIOGRAM
Anesthesia: LOCAL

## 2014-02-04 MED ORDER — IPRATROPIUM-ALBUTEROL 0.5-2.5 (3) MG/3ML IN SOLN
RESPIRATORY_TRACT | Status: AC
  Filled 2014-02-04: qty 3

## 2014-02-04 MED ORDER — POTASSIUM CHLORIDE 10 MEQ/100ML IV SOLN
10.0000 meq | INTRAVENOUS | Status: AC
  Administered 2014-02-04 (×4): 10 meq via INTRAVENOUS
  Filled 2014-02-04 (×3): qty 100

## 2014-02-04 MED ORDER — ALBUTEROL SULFATE (2.5 MG/3ML) 0.083% IN NEBU
2.5000 mg | INHALATION_SOLUTION | RESPIRATORY_TRACT | Status: DC | PRN
  Administered 2014-02-10: 2.5 mg via RESPIRATORY_TRACT
  Filled 2014-02-04: qty 3

## 2014-02-04 MED ORDER — FUROSEMIDE 10 MG/ML IJ SOLN
40.0000 mg | Freq: Once | INTRAMUSCULAR | Status: AC
  Administered 2014-02-04: 12:00:00 40 mg via INTRAVENOUS
  Filled 2014-02-04: qty 4

## 2014-02-04 MED ORDER — IPRATROPIUM-ALBUTEROL 0.5-2.5 (3) MG/3ML IN SOLN
3.0000 mL | Freq: Four times a day (QID) | RESPIRATORY_TRACT | Status: DC
  Administered 2014-02-04 (×2): 3 mL via RESPIRATORY_TRACT
  Filled 2014-02-04: qty 3

## 2014-02-04 MED ORDER — IPRATROPIUM-ALBUTEROL 0.5-2.5 (3) MG/3ML IN SOLN
3.0000 mL | Freq: Three times a day (TID) | RESPIRATORY_TRACT | Status: DC
  Administered 2014-02-04 – 2014-02-08 (×11): 3 mL via RESPIRATORY_TRACT
  Filled 2014-02-04 (×11): qty 3

## 2014-02-04 MED ORDER — SODIUM CHLORIDE 0.9 % IV SOLN
INTRAVENOUS | Status: DC
  Administered 2014-02-05: 01:00:00 via INTRAVENOUS

## 2014-02-04 MED ORDER — FUROSEMIDE 10 MG/ML IJ SOLN
80.0000 mg | Freq: Two times a day (BID) | INTRAMUSCULAR | Status: DC
  Administered 2014-02-04: 80 mg via INTRAVENOUS
  Filled 2014-02-04 (×3): qty 8

## 2014-02-04 NOTE — Progress Notes (Signed)
Pt very weak, [facility]  Bedbath completed by NT x 2, pt drowsy and cooperative.  Large homemade bandaid drsg removed from rt foot. No open areas noted to skin but feet very dry, scaly, with overgrowth of thick skin and long thick brown toenails, all cleansed well with soap and water.  Tele much more regular when sleeping but still has freq PVC couplets, HR 70-80's.  Pt had 6 beat VT, asymptomatic, BP 135/72.  IV KCL runs completed, awaiting lab to draw follow-up BMET.  Safety sitter remains in room.

## 2014-02-04 NOTE — Progress Notes (Signed)
Patient Name: [name] Contreras Date of Encounter: 02/04/2014     Principal Problem:   Acute heart failure Active Problems:   NSTEMI (non-ST elevated myocardial infarction)   Dementia   Hypertension   Hypothyroidism   Dyslipidemia   Prolonged QT interval   SUBJECTIVE  Was initially somewhat sedate this AM after receiving PO xanax @ 10PM last night.  Previously seen by Dr. Herbie Baltimore this AM and was not felt to be a good cath candidate.  She is awake and alert now.  She is able to converse and is disoriented to time/place/situation.  She denies chest pain or dyspnea.  CURRENT MEDS . ALPRAZolam  0.25 mg Oral Once  . amLODipine  5 mg Oral Daily  . antiseptic oral rinse  7 mL Mouth Rinse BID  . aspirin  81 mg Oral Pre-Cath  . [START ON 02/05/2014] aspirin EC  81 mg Oral Daily  . atorvastatin  80 mg Oral q1800  . furosemide  40 mg Intravenous BID  . ipratropium-albuterol  3 mL Nebulization Q6H  . ipratropium-albuterol      . levothyroxine  25 mcg Oral QAC breakfast  . lisinopril  10 mg Oral Daily  . metoprolol tartrate  12.5 mg Oral BID    OBJECTIVE  Filed Vitals:   02/04/14 0050 02/04/14 0519 02/04/14 0735 02/04/14 0816  BP: 103/80 135/72 106/48   Pulse: 76 89 84   Temp:  98.7 F (37.1 C) 98.3 F (36.8 C)   TempSrc:  Oral Axillary   Resp:  20 18   Height:      Weight:  156 lb 15.5 oz (71.2 kg)    SpO2: 100% 100% 100% 100%    Intake/Output Summary (Last 24 hours) at 02/04/14 0910 Last data filed at 02/04/14 0700  Gross per 24 hour  Intake 1046.06 ml  Output    600 ml  Net 446.06 ml   Filed Weights   02/02/14 2227 02/03/14 0233 02/04/14 0519  Weight: 140 lb (63.504 kg) 156 lb 1.4 oz (70.8 kg) 156 lb 15.5 oz (71.2 kg)    PHYSICAL EXAM  General: Pleasant, NAD. Neuro: Alert and oriented X 3. Moves all extremities spontaneously. Psych: Normal affect. HEENT:  Normal  Neck: Supple without bruits.  JVP ~ 12 cm. Lungs:  Resp regular and unlabored, diminished breath  sounds throughout with faint crackles 1/2 up bilat - ? Effort. Heart: Irreg, no s3, s4, or murmurs. Abdomen: Soft, non-tender, non-distended, BS + x 4.  Extremities: No clubbing, cyanosis.  Trace bilat LEE.  DP/PT/Radials 2+ and equal bilaterally.  Accessory Clinical Findings  CBC  Recent Labs  02/02/14 2330 02/03/14 0345  02/03/14 2000 02/04/14 0720  WBC 11.3* 12.7*  < > 12.1* 10.0  NEUTROABS 9.1* 9.5*  --   --   --   HGB 11.1* 10.8*  < > 10.8* 10.6*  HCT 33.3* 32.7*  < > 32.8* 32.1*  MCV 86.7 86.1  < > 85.4 86.1  PLT 257 270  < > 263 202  < > = values in this interval not displayed. Basic Metabolic Panel  Recent Labs  02/03/14 0345 02/03/14 0954 02/03/14 2000 02/04/14 0720  NA 148*  --  148* 144  K 4.0  --  3.3* 3.7  CL 108  --  104 103  CO2 22  --  26 26  GLUCOSE 120*  --  142* 92  BUN 27*  --  27* 25*  CREATININE 1.29*  --  1.29* 1.15*  CALCIUM 9.3  --  9.3 8.6  MG  --  1.9  --   --    Liver Function Tests  Recent Labs  02/02/14 2330 02/03/14 0345  AST 51* 48*  ALT 21 19  ALKPHOS 152* 143*  BILITOT 0.5 0.3  PROT 8.2 7.6  ALBUMIN 3.6 3.3*   Cardiac Enzymes  Recent Labs  02/03/14 0954 02/03/14 1419 02/03/14 2000  TROPONINI 1.69* 1.06* 2.08*   Hemoglobin A1C  Recent Labs  02/03/14 0345  HGBA1C 6.0*   Fasting Lipid Panel  Recent Labs  02/03/14 0345  CHOL 166  HDL 76  LDLCALC 73  TRIG 83  CHOLHDL 2.2   Thyroid Function Tests  Recent Labs  02/03/14 0345  TSH 3.290    TELE  Sinus, freq pac's with brief runs of PAT, freq pvc's, couplets, and up to 6 beats of nsvt overnight.  Radiology/Studies  Ct Head Wo Contrast  02/03/2014   CLINICAL DATA:  Confused  EXAM: CT HEAD WITHOUT CONTRAST  TECHNIQUE: Contiguous axial images were obtained from the base of the skull through the vertex without intravenous contrast.  COMPARISON:  None.  FINDINGS: No evidence of parenchymal hemorrhage or extra-axial fluid collection. No mass lesion, mass  effect, or midline shift.  No CT evidence of acute infarction.  Subcortical white matter and periventricular small vessel ischemic changes. Mild intracranial atherosclerosis.  Age related atrophy.  No ventriculomegaly.  The visualized paranasal sinuses are essentially clear. The mastoid air cells are unopacified.  No evidence of calvarial fracture.  IMPRESSION: No evidence of acute intracranial abnormality.  Atrophy with small vessel ischemic changes and intracranial atherosclerosis.   Electronically Signed   By: Charline BillsSriyesh  Krishnan M.D.   On: 02/03/2014 00:30    ASSESSMENT AND PLAN  1.  Acute CHF - ? syst vs diast:  Echo pending this morning.  She continues to have crackles on exam in the setting of overall poor air mvmt and her JVP remains elevated with trace lower ext edema.  I/O + 446 yesterday, wt unchanged since.  Renal fxn stable.  Await echo.  Cont current dose of IV lasix.  HR/BP stable.  Cont bb, acei.  2.  NSTEMI:  Somewhat flat and variable troponin trend - 1.69-->1.06-->2.08.  No chest pain.  Consideration has been given to diagnostic cath however she has been intermittently agitated requiring benzodiazepines and currently is disoriented to time/place/situation.  Not a candidate for cath this morning.  Await echo.  If EF nl, would likely plan to defer any invasive eval.  Consider non-invasive eval for risk stratification.  Continue medical therapy - asa, statin, bb.  Could also add plavix.  3.  HTN:  Stable - bb/acei/norvasc.  4.  HL:  LDL 73 - she was on simva @ home.  5. Dementia/agitation:  Prn benzo's.  6.  Hypokalemia:  Stable this AM.  7.  Atrial and Ventricular ectopy:  PAC's/PAT, NSVT - all seemingly asymptomatic.  Echo pending.  Cont bb.  Signed, Nicolasa Duckinghristopher Arli Bree NP

## 2014-02-04 NOTE — Progress Notes (Signed)
  Echocardiogram 2D Echocardiogram has been performed.  [name] Contreras, [name] Contreras 02/04/2014, 8:58 AM

## 2014-02-04 NOTE — Evaluation (Signed)
Physical Therapy Evaluation Patient Details Name: [name] Contreras MRN: [id number] DOB: 11/23/1925 Today's Date: 02/04/2014   History of Present Illness  Pt was admitted for acute heart failure.  Rising troponins suggest NSTEMI.  Cath cancelled in favor of medical management.  She has a h/o dementia  Clinical Impression  Pt admitted with/for CHF.  Pt currently limited functionally due to the problems listed. ( See problems list.)   Pt will benefit from PT to maximize function and safety in order to get ready for next venue listed below.      Follow Up Recommendations SNF    Equipment Recommendations  None recommended by PT    Recommendations for Other Services       Precautions / Restrictions Precautions Precautions: Fall Restrictions Weight Bearing Restrictions: No      Mobility  Bed Mobility Overal bed mobility: Needs Assistance Bed Mobility: Supine to Sit;Sit to Supine     Supine to sit: Min guard Sit to supine: Min guard   General bed mobility comments: cues for guidance and pt moved without need for help  Transfers Overall transfer level: Needs assistance Equipment used: Rolling walker (2 wheeled);None Transfers: Sit to/from Stand Sit to Stand: Min assist Stand pivot transfers: Min assist       General transfer comment: cues for hand placement; and stability assist  Ambulation/Gait Ambulation/Gait assistance: Min assist Ambulation Distance (Feet): 50 Feet Assistive device: Rolling walker (2 wheeled) Gait Pattern/deviations: Step-through pattern;Trunk flexed Gait velocity: slow   General Gait Details: mildly unsteady gait with tendency to walk outside the RW until cued, but should be safe with assist from family  Stairs            Wheelchair Mobility    Modified Rankin (Stroke Patients Only)       Balance Overall balance assessment: Needs assistance   Sitting balance-Leahy Scale: Fair     Standing balance support: Bilateral upper  extremity supported Standing balance-Leahy Scale: Poor Standing balance comment: tendency to list posteriorly with loss of focus.                             Pertinent Vitals/Pain Pain Assessment: No/denies pain    Home Living Family/patient expects to be discharged to:: Unsure Living Arrangements: Spouse/significant other;Children               Additional Comments: lives at home with husband; children check on her    Prior Function Level of Independence: Needs assistance         Comments: she uses walker and cane; husband uses cane and they managed together     Hand Dominance        Extremity/Trunk Assessment   Upper Extremity Assessment: Overall WFL for tasks assessed           Lower Extremity Assessment: Overall WFL for tasks assessed;Generalized weakness      Cervical / Trunk Assessment: Kyphotic  Communication   Communication: No difficulties  Cognition Arousal/Alertness: Awake/alert Behavior During Therapy: Impulsive Overall Cognitive Status: History of cognitive impairments - at baseline                      General Comments      Exercises        Assessment/Plan    PT Assessment Patient needs continued PT services  PT Diagnosis Abnormality of gait;Generalized weakness   PT Problem List Decreased strength;Decreased activity tolerance;Decreased balance;Decreased mobility;Decreased cognition;Decreased knowledge of  use of DME;Cardiopulmonary status limiting activity;Decreased knowledge of precautions  PT Treatment Interventions DME instruction;Gait training;Functional mobility training;Therapeutic activities;Balance training;Patient/family education   PT Goals (Current goals can be found in the Care Plan section) Acute Rehab PT Goals Patient Stated Goal: none stated PT Goal Formulation: With patient Time For Goal Achievement: 02/11/14 Potential to Achieve Goals: Good    Frequency Min 3X/week   Barriers to discharge         Co-evaluation               End of Session   Activity Tolerance: Patient tolerated treatment well Patient left: in bed;with call bell/phone within reach;with nursing/sitter in room Nurse Communication: Mobility status         Time: [id number] PT Time Calculation (min): 24 min   Charges:   PT Evaluation $Initial PT Evaluation Tier I: 1 Procedure PT Treatments $Gait Training: 8-22 mins   PT G Codes:          Ura Yingling, Eliseo GumKenneth V 02/04/2014, 5:37 PM 02/04/2014  Woodway BingKen Avangelina Flight, PT [phone]  (pager)

## 2014-02-04 NOTE — Progress Notes (Addendum)
I have personally seen and examined patient and agree with note as outlined by Nicolasa Duckinghristopher Berge NP.  She is still volume overloaded by exam.  MS appears improved with less agitation and confusion.  I&O's still positive so I will increase her Lasix to 80mg  BID IV.  Will follow renal function closely.  Cath on hold for now.  Will get 2D echo and assess LVF.  If EF normal then will proceed with medical management.  If EF reduced consider cath early next week once CCHF improves.  UA shows rare bacteria but 21-50 WBC's - she has no dysuria.  Will repeat and send off a culture.  BP soft this am so will stop Lisinopril.  Continue low dose BB for NSTEMI and CHF.  Her EKG on admit showed a long QTc in the setting of ischemic T wave changes from NSTEMI.  She is not on any QT prolonging drugs.  Will repeat EKG today.

## 2014-02-04 NOTE — Progress Notes (Signed)
   Interventional Cardiology Note:  I came to the unit to evaluate her prior to cardiac catheterization. Indeed, her troponin levels are a bit more elevated. She was apparently quite aggitated overnight & received Ativan @ ~10PM.  She has had a sitter with her since arrival. Currently, I am unable to arouse her enough to fully assess her symptoms & am not able to discuss the procedure with her.  I do not feel that she is able to give consent for an invasive procedure at this point.  I do not think she is acceptable as a Cardiac Cath Candidate at this time.  I will ask my colleagues to reassess her later today to determine her progression. She is breathing comfortably & is lying almost flat - suggesting adequate diuresis.  At this point, I feel that Early Conservative Medical Management if the best course of action in this elderly woman with at least mild dementia. == the Echo was not yet done.  She has a UA  that appears to be contaminated.  BP & HR stable.   Marykay LexHARDING,DAVID W, M.D., M.S. Interventional Cardiologist   Pager # [phone]
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
## 2014-02-04 NOTE — Progress Notes (Addendum)
Tele shows increased PVC's, freq PVC couplets and PAC's and a short run of SVT.  K+ 3.3.  Dr. Zachery ConchFriedman notified, orders received, pt took all 4 tablets of 10 meq KCl after much coaxing but then coughed and spit three of them out after they got "hung in the throat."  Dr. Zachery ConchFriedman notified and orders received.  Pt dozing quietly now, HR more regular, rate down to 70's but still occas couplets.  First of 4 iv runs of KCl begun after verifying with pharmacy that it is compatible with heparin.  IV team notified for 2nd IV for cath in am.  BP 103/80.  Sitter remains at bedside.

## 2014-02-04 NOTE — Clinical Documentation Improvement (Signed)
.   Chart Notes state acute NSTEMI with "Acute CHF", BNP 33,063 treated with IV Lasix. Please clarify "Acute CHF" with type to illustrate severity of illness and risk of mortality.   Thank Bonita QuinYou,  Clide CliffLaurie Inza Mikrut RN CDI [phone] HIM department [phone] Cell
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
## 2014-02-04 NOTE — Progress Notes (Signed)
ANTICOAGULATION CONSULT NOTE - Follow Up Consult  Pharmacy Consult for heparin Indication: NSTEMI  No Known Allergies  Patient Measurements: Height: 5\' 5"  (165.1 cm) Weight: 156 lb 15.5 oz (71.2 kg) IBW/kg (Calculated) : 57  Vital Signs: Temp: 98.4 F (36.9 C) (10/02 1230) Temp Source: Oral (10/02 1230) BP: 105/62 mmHg (10/02 1230) Pulse Rate: 66 (10/02 1230)  Labs:  Recent Labs  02/02/14 2330 02/03/14 0345 02/03/14 0954 02/03/14 1419 02/03/14 2000 02/04/14 0720  HGB 11.1* 10.8* 11.4*  --  10.8* 10.6*  HCT 33.3* 32.7* 36.2  --  32.8* 32.1*  PLT 257 270 249  --  263 202  LABPROT  --  16.6*  --   --  16.4*  --   INR  --  1.34  --   --  1.32  --   HEPARINUNFRC  --   --  <0.10*  --  0.42 0.43  CREATININE 1.30* 1.29*  --   --  1.29* 1.15*  TROPONINI 4.09*  --  1.69* 1.06* 2.08*  --     Estimated Creatinine Clearance: 35.4 ml/min (by C-G formula based on Cr of 1.15).   Medications:  Prescriptions prior to admission  Medication Sig Dispense Refill  . ALPRAZolam (XANAX) 0.5 MG tablet Take 0.5 mg by mouth at bedtime as needed for anxiety.      Marland Kitchen. amLODipine (NORVASC) 5 MG tablet Take 5 mg by mouth daily.      Marland Kitchen. aspirin EC 81 MG tablet Take 81 mg by mouth daily.      Marland Kitchen. ipratropium-albuterol (DUONEB) 0.5-2.5 (3) MG/3ML SOLN Take 3 mLs by nebulization every 6 (six) hours as needed.      Marland Kitchen. levothyroxine (SYNTHROID, LEVOTHROID) 25 MCG tablet Take 25 mcg by mouth daily before breakfast.      . lisinopril (PRINIVIL,ZESTRIL) 10 MG tablet Take 10 mg by mouth daily.      Marland Kitchen. PROAIR HFA 108 (90 BASE) MCG/ACT inhaler Inhale 2 puffs into the lungs every 4 (four) hours as needed.      . simvastatin (ZOCOR) 20 MG tablet Take 20 mg by mouth daily.       Scheduled:  . ALPRAZolam  0.25 mg Oral Once  . amLODipine  5 mg Oral Daily  . antiseptic oral rinse  7 mL Mouth Rinse BID  . aspirin  81 mg Oral Pre-Cath  . [START ON 02/05/2014] aspirin EC  81 mg Oral Daily  . atorvastatin  80 mg Oral  q1800  . furosemide  80 mg Intravenous BID  . ipratropium-albuterol  3 mL Nebulization Q6H  . ipratropium-albuterol      . levothyroxine  25 mcg Oral QAC breakfast  . metoprolol tartrate  12.5 mg Oral BID    Assessment: 80[facility]/ [facility]/o weakness and SOB, found with elevated troponin. Continue on IV heparin, heparin level 0.43 on 950 units/hr. Cbc stable. Will hold off for cath for now and getting Echo to assess LV function. Might consider cath early next week if EF is reduced.   Goal of Therapy: Heparin level 0.3-0.7 units/ml Monitor platelets by anticoagulation protocol: Yes   Plan:  Continue heparin infusion at 950 units/hr Daily heparin and CBC  Bayard HuggerMei Iza Preston, PharmD, BCPS  Clinical Pharmacist  Pager: [phone]  02/04/2014,1:36 PM

## 2014-02-05 DIAGNOSIS — I5021 Acute systolic (congestive) heart failure: Secondary | ICD-10-CM

## 2014-02-05 LAB — BASIC METABOLIC PANEL
ANION GAP: 12 (ref 5–15)
BUN: 23 mg/dL (ref 6–23)
CALCIUM: 8.2 mg/dL — AB (ref 8.4–10.5)
CO2: 31 meq/L (ref 19–32)
CREATININE: 1.06 mg/dL (ref 0.50–1.10)
Chloride: 103 mEq/L (ref 96–112)
GFR calc Af Amer: 54 mL/min — ABNORMAL LOW (ref >90)
GFR, EST NON AFRICAN AMERICAN: 47 mL/min — AB (ref >90)
Glucose, Bld: 108 mg/dL — ABNORMAL HIGH (ref 70–99)
Potassium: 2.8 mEq/L — CL (ref 3.7–5.3)
Sodium: 146 mEq/L (ref 137–147)

## 2014-02-05 LAB — CBC
HCT: 31.1 % — ABNORMAL LOW (ref 36.0–46.0)
Hemoglobin: 10.1 g/dL — ABNORMAL LOW (ref 12.0–15.0)
MCH: 28.1 pg (ref 26.0–34.0)
MCHC: 32.5 g/dL (ref 30.0–36.0)
MCV: 86.6 fL (ref 78.0–100.0)
Platelets: 209 10*3/uL (ref 150–400)
RBC: 3.59 MIL/uL — ABNORMAL LOW (ref 3.87–5.11)
RDW: 15.3 % (ref 11.5–15.5)
WBC: 10 10*3/uL (ref 4.0–10.5)

## 2014-02-05 LAB — HEPARIN LEVEL (UNFRACTIONATED): Heparin Unfractionated: 0.37 IU/mL (ref 0.30–0.70)

## 2014-02-05 MED ORDER — POTASSIUM CHLORIDE ER 10 MEQ PO TBCR
40.0000 meq | EXTENDED_RELEASE_TABLET | Freq: Every day | ORAL | Status: DC
  Administered 2014-02-05 – 2014-02-06 (×2): 40 meq via ORAL
  Filled 2014-02-05 (×3): qty 4

## 2014-02-05 MED ORDER — POTASSIUM CHLORIDE 10 MEQ/100ML IV SOLN
10.0000 meq | INTRAVENOUS | Status: AC
  Administered 2014-02-05 (×4): 10 meq via INTRAVENOUS
  Filled 2014-02-05 (×5): qty 100

## 2014-02-05 MED ORDER — POTASSIUM CHLORIDE ER 10 MEQ PO TBCR
20.0000 meq | EXTENDED_RELEASE_TABLET | Freq: Every day | ORAL | Status: DC
  Filled 2014-02-05: qty 2

## 2014-02-05 MED ORDER — FUROSEMIDE 10 MG/ML IJ SOLN
80.0000 mg | Freq: Three times a day (TID) | INTRAMUSCULAR | Status: DC
  Administered 2014-02-05 – 2014-02-07 (×9): 80 mg via INTRAVENOUS
  Filled 2014-02-05 (×10): qty 8

## 2014-02-05 MED ORDER — METOPROLOL SUCCINATE 12.5 MG HALF TABLET
12.5000 mg | ORAL_TABLET | Freq: Every day | ORAL | Status: DC
  Administered 2014-02-05 – 2014-02-10 (×6): 12.5 mg via ORAL
  Filled 2014-02-05 (×6): qty 1

## 2014-02-05 NOTE — Progress Notes (Signed)
ANTICOAGULATION CONSULT NOTE - Follow Up Consult  Pharmacy Consult for heparin Indication: NSTEMI  No Known Allergies  Patient Measurements: Height: 5\' 5"  (165.1 cm) Weight: 158 lb 4.6 oz (71.8 kg) IBW/kg (Calculated) : 57  Vital Signs: Temp: 97.9 F (36.6 C) (10/03 1038) Temp Source: Oral (10/03 1038) BP: 116/64 mmHg (10/03 1038) Pulse Rate: 62 (10/03 1038)  Labs:  Recent Labs  02/02/14 2330 02/03/14 0345  02/03/14 0954 02/03/14 1419 02/03/14 2000 02/04/14 0720 02/05/14 0410  HGB 11.1* 10.8*  --  11.4*  --  10.8* 10.6* 10.1*  HCT 33.3* 32.7*  --  36.2  --  32.8* 32.1* 31.1*  PLT 257 270  --  249  --  263 202 209  LABPROT  --  16.6*  --   --   --  16.4*  --   --   INR  --  1.34  --   --   --  1.32  --   --   HEPARINUNFRC  --   --   < > <0.10*  --  0.42 0.43 0.37  CREATININE 1.30* 1.29*  --   --   --  1.29* 1.15* 1.06  TROPONINI 4.09*  --   --  1.69* 1.06* 2.08*  --   --   < > = values in this interval not displayed.  Estimated Creatinine Clearance: 38.5 ml/min (by C-G formula based on Cr of 1.06).   Medications:  Prescriptions prior to admission  Medication Sig Dispense Refill  . ALPRAZolam (XANAX) 0.5 MG tablet Take 0.5 mg by mouth at bedtime as needed for anxiety.      Marland Kitchen. amLODipine (NORVASC) 5 MG tablet Take 5 mg by mouth daily.      Marland Kitchen. aspirin EC 81 MG tablet Take 81 mg by mouth daily.      Marland Kitchen. ipratropium-albuterol (DUONEB) 0.5-2.5 (3) MG/3ML SOLN Take 3 mLs by nebulization every 6 (six) hours as needed.      Marland Kitchen. levothyroxine (SYNTHROID, LEVOTHROID) 25 MCG tablet Take 25 mcg by mouth daily before breakfast.      . lisinopril (PRINIVIL,ZESTRIL) 10 MG tablet Take 10 mg by mouth daily.      Marland Kitchen. PROAIR HFA 108 (90 BASE) MCG/ACT inhaler Inhale 2 puffs into the lungs every 4 (four) hours as needed.      . simvastatin (ZOCOR) 20 MG tablet Take 20 mg by mouth daily.       Scheduled:  . ALPRAZolam  0.25 mg Oral Once  . amLODipine  5 mg Oral Daily  . antiseptic oral  rinse  7 mL Mouth Rinse BID  . aspirin EC  81 mg Oral Daily  . atorvastatin  80 mg Oral q1800  . furosemide  80 mg Intravenous TID  . ipratropium-albuterol  3 mL Nebulization TID  . levothyroxine  25 mcg Oral QAC breakfast  . metoprolol succinate  12.5 mg Oral Daily  . potassium chloride  40 mEq Oral Daily    Assessment: 80[facility]/o weakness and SOB, found with elevated troponin. Continue on IV heparin, heparin level therapeutic. Cbc stable (slight trend down)  Goal of Therapy: Heparin level 0.3-0.7 units/ml Monitor platelets by anticoagulation protocol: Yes   Plan:  Continue heparin infusion at 950 units/hr Daily labs Cath planned for Monday  Thank you Okey RegalLisa Elianna Windom, PharmD [phone] 02/05/2014,11:05 AM

## 2014-02-05 NOTE — Evaluation (Signed)
Clinical/Bedside Swallow Evaluation Patient Details  Name: Enora Trillo MRN: [id number] Date of Birth: [birth year]  Today's Date: 02/05/2014 Time: 1109-1140 SLP Time Calculation (min): 31 min  Past Medical History:  Past Medical History  Diagnosis Date  . Asthma   . Hypertension   . Hyperlipemia   . Insomnia   . Thyroid disease    Past Surgical History:  Past Surgical History  Procedure Laterality Date  . Tubal ligation     HPI:  80 yo female adm to [facility] with CHF, confusion, dyspnea- rising troponins suggest NSTEMI.  Pt has h/o dementia, emphysema.  Medical management of current issues planned for chart review.  Bedside swallow evaluation ordered due to pt having problems gagging on solids with upper dentures in place and diet was downgraded to puree by RN due to poor pt tolerance of solids.       Assessment / Plan / Recommendation Clinical Impression  Pt presents with no focal CN deficits and can self feed however cognitive oral deficits noted.  No overt indications of aspiration/airway compromise with cracker that was "mushy", applesauce, juice *orange/apple, and water.  Pt does demonstrate poor oral control/transiting of solids though she states she uses dentures at home.    Significant problems swallowing large pills with puree noted evidenced by pt stating "It's stuck in my throat".  Post-swallow gurgling/gagging noted with expectoration of potassium pill with applesauce.  SLP questions if symptoms may be indicative of UES dysfunction.  Pt does admit to some issues swallowing pills at home but reports they are not significant.    Recommend crush all pills of significant size due to aspiration risk.   Continue puree/thin diet *due to her lack of upper dentures and gagging issues.   SLP to follow up for diet tolerance/dietary advancement and/or indication for instrumental evaluation.  Educated pt to plan using teach back for reinforcement, however with her dementia - she may not  retain information.  Spoke with RN, sitter and pt regarding plan.      Aspiration Risk  Moderate    Diet Recommendation Dysphagia 1 (Puree);Thin liquid   Liquid Administration via: Cup;Straw Medication Administration: Crushed with puree (if not contraindicated and large) Supervision: Patient able to self feed Compensations: Slow rate;Small sips/bites;Check for pocketing Postural Changes and/or Swallow Maneuvers: Seated upright 90 degrees;Upright 30-60 min after meal    Other  Recommendations Oral Care Recommendations: Oral care BID   Follow Up Recommendations   (tbd)    Frequency and Duration min 2x/week  2 weeks   Pertinent Vitals/Pain Afebrile, decreased      Swallow Study Prior Functional Status   see HHX    General HPI: 80 yo female adm to [facility] with CHF, confusion, dyspnea- rising troponins suggest NSTEMI.  Pt has h/o dementia, emphysema.  Medical management of current issues planned for chart review.  Bedside swallow evaluation ordered due to pt having problems gagging on solids with upper dentures in place and diet was downgraded to puree by RN due to poor pt tolerance of solids.     Type of Study: Bedside swallow evaluation Diet Prior to this Study: Dysphagia 1 (puree);Thin liquids Temperature Spikes Noted: No Respiratory Status: Nasal cannula Behavior/Cognition: Alert;Cooperative;Pleasant mood Oral Cavity - Dentition: Dentures, bottom (upper dentures at home) Self-Feeding Abilities: Able to feed self Patient Positioning: Upright in bed Baseline Vocal Quality: Clear Volitional Cough: Strong Volitional Swallow: Able to elicit    Oral/Motor/Sensory Function Overall Oral Motor/Sensory Function: Appears within functional limits for tasks assessed  Ice Chips Ice chips: Not tested   Thin Liquid Thin Liquid: Within functional limits Presentation: Straw;Cup;Self Fed    Nectar Thick Nectar Thick Liquid: Not tested   Honey Thick Honey Thick Liquid: Not tested   Puree  Puree: Within functional limits Presentation: Self Fed;Spoon   Solid   GO    Solid: Impaired Presentation: Self Fed Oral Phase Impairments: Reduced lingual movement/coordination;Impaired anterior to posterior transit Oral Phase Functional Implications: Oral residue;Other (comment) (prolonged mastication)       Donavan Burnetamara Treylin Burtch, MS [facility] SLP [phone]
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
## 2014-02-05 NOTE — Progress Notes (Signed)
Patient ID: [name] Contreras, female   DOB: Oct 11, 1925, 80 y.o.   MRN: [id number]      Subjective:    Still with some SOB. No chest pain  Objective:   Temp:  [98.2 F (36.8 C)-98.4 F (36.9 C)] 98.2 F (36.8 C) (10/03 0432) Pulse Rate:  [35-84] 35 (10/03 0432) Resp:  [18-20] 18 (10/03 0432) BP: (105-116)/(48-65) 116/65 mmHg (10/03 0432) SpO2:  [97 %-100 %] 100 % (10/03 0432) Weight:  [158 lb 4.6 oz (71.8 kg)] 158 lb 4.6 oz (71.8 kg) (10/03 0500) Last BM Date: 02/02/14  Filed Weights   02/04/14 0519 02/05/14 0004 02/05/14 0500  Weight: 156 lb 15.5 oz (71.2 kg) 158 lb 4.6 oz (71.8 kg) 158 lb 4.6 oz (71.8 kg)    Intake/Output Summary (Last 24 hours) at 02/05/14 0649 Last data filed at 02/05/14 0400  Gross per 24 hour  Intake    779 ml  Output   1700 ml  Net   -921 ml    Telemetry: sinus, PVCs  Exam:  General: NAD  Resp: Crackles bilateral bases  Cardiac: RRR, no m/r/g, + JVD  GI: abdomen soft, NT, ND  MSK: no LE edema  Neuro: no focal deficits    Lab Results:  Basic Metabolic Panel:  Recent Labs Lab 02/03/14 0345 02/03/14 0954 02/03/14 2000 02/04/14 0720 02/05/14 0410  NA 148*  --  148* 144 146  K 4.0  --  3.3* 3.7 2.8*  CL 108  --  104 103 103  CO2 22  --  26 26 31   GLUCOSE 120*  --  142* 92 108*  BUN 27*  --  27* 25* 23  CREATININE 1.29*  --  1.29* 1.15* 1.06  CALCIUM 9.3  --  9.3 8.6 8.2*  MG  --  1.9  --   --   --     Liver Function Tests:  Recent Labs Lab 02/02/14 2330 02/03/14 0345  AST 51* 48*  ALT 21 19  ALKPHOS 152* 143*  BILITOT 0.5 0.3  PROT 8.2 7.6  ALBUMIN 3.6 3.3*    CBC:  Recent Labs Lab 02/03/14 2000 02/04/14 0720 02/05/14 0410  WBC 12.1* 10.0 10.0  HGB 10.8* 10.6* 10.1*  HCT 32.8* 32.1* 31.1*  MCV 85.4 86.1 86.6  PLT 263 202 209    Cardiac Enzymes:  Recent Labs Lab 02/03/14 0954 02/03/14 1419 02/03/14 2000  TROPONINI 1.69* 1.06* 2.08*    BNP:  Recent Labs  02/02/14 2330 02/03/14 0345    PROBNP 34552.0* 33063.0*    Coagulation:  Recent Labs Lab 02/03/14 0345 02/03/14 2000  INR 1.34 1.32    ECG:   Medications:   Scheduled Medications: . ALPRAZolam  0.25 mg Oral Once  . amLODipine  5 mg Oral Daily  . antiseptic oral rinse  7 mL Mouth Rinse BID  . aspirin EC  81 mg Oral Daily  . atorvastatin  80 mg Oral q1800  . furosemide  80 mg Intravenous BID  . ipratropium-albuterol  3 mL Nebulization TID  . levothyroxine  25 mcg Oral QAC breakfast  . metoprolol tartrate  12.5 mg Oral BID  . potassium chloride  20 mEq Oral Daily  . potassium chloride  10 mEq Intravenous Q1 Hr x 4     Infusions: . sodium chloride 10 mL/hr at 02/05/14 0400  . heparin 950 Units/hr (02/05/14 0400)     PRN Medications:  acetaminophen, albuterol, ALPRAZolam, nitroGLYCERIN, ondansetron (ZOFRAN) IV     Assessment/Plan  80 yo female hx of HTN, asthma, HL, hypothyroidism, early dementia, admitted with SOB and LE edema.   1. NSTEMI - peak trop 4, cath on hold after evaluation yesterday AM where patient was altered and remained significant volume overloaded - echo 02/04/14 LVEF 25%, multiple WMAs, mod to severe TR.  - she is on medical therapy, with ASA, atorva, metop 12.5mg  bid, hep gtt.  - continue medical therapy, once more euvolemic consider likely cath on Monday.  2. Acute HF - echo LVEF 25%, multiple WMAs - she is negative 900mL yesterday, total net negative 400mL. She is on lasix 80mg  IV bid. Cr improving with diuresis. - still appears volume overloaded, increase lasix to tid - change lopressor to Toprol XL  3. Hypokalemia - receiving KCl this morning  Dina RichJonathan Assata Juncaj, M.D.

## 2014-02-05 NOTE — Progress Notes (Signed)
CRITICAL VALUE ALERT  Critical value received:  Potassium= 2.8  Date of notification:  02/05/14  Time of notification:  05:20  Critical value read back:Yes.    Nurse who received alert:  Sander Radonoselle Tapanga Ottaway RN  MD notified (1st page):  D. Terressa KoyanagiBalfour  Time of first page:  05:25  MD notified (2nd page):  Time of second page:  Responding MD:  Dr. Terressa KoyanagiBalfour  Time MD responded:  05:26

## 2014-02-06 DIAGNOSIS — J449 Chronic obstructive pulmonary disease, unspecified: Secondary | ICD-10-CM | POA: Diagnosis present

## 2014-02-06 DIAGNOSIS — I4581 Long QT syndrome: Secondary | ICD-10-CM

## 2014-02-06 DIAGNOSIS — J4489 Other specified chronic obstructive pulmonary disease: Secondary | ICD-10-CM | POA: Diagnosis present

## 2014-02-06 DIAGNOSIS — F0391 Unspecified dementia with behavioral disturbance: Secondary | ICD-10-CM

## 2014-02-06 LAB — COMPREHENSIVE METABOLIC PANEL
ALT: 19 U/L (ref 0–35)
AST: 45 U/L — ABNORMAL HIGH (ref 0–37)
Albumin: 3 g/dL — ABNORMAL LOW (ref 3.5–5.2)
Alkaline Phosphatase: 112 U/L (ref 39–117)
Anion gap: 12 (ref 5–15)
BUN: 20 mg/dL (ref 6–23)
CALCIUM: 8.1 mg/dL — AB (ref 8.4–10.5)
CO2: 35 mEq/L — ABNORMAL HIGH (ref 19–32)
Chloride: 95 mEq/L — ABNORMAL LOW (ref 96–112)
Creatinine, Ser: 1.04 mg/dL (ref 0.50–1.10)
GFR calc non Af Amer: 48 mL/min — ABNORMAL LOW (ref >90)
GFR, EST AFRICAN AMERICAN: 55 mL/min — AB (ref >90)
Glucose, Bld: 115 mg/dL — ABNORMAL HIGH (ref 70–99)
Potassium: 3.2 mEq/L — ABNORMAL LOW (ref 3.7–5.3)
SODIUM: 142 meq/L (ref 137–147)
TOTAL PROTEIN: 7.3 g/dL (ref 6.0–8.3)
Total Bilirubin: 0.5 mg/dL (ref 0.3–1.2)

## 2014-02-06 LAB — CBC
HCT: 32.9 % — ABNORMAL LOW (ref 36.0–46.0)
HEMOGLOBIN: 10.9 g/dL — AB (ref 12.0–15.0)
MCH: 28.5 pg (ref 26.0–34.0)
MCHC: 33.1 g/dL (ref 30.0–36.0)
MCV: 85.9 fL (ref 78.0–100.0)
Platelets: 183 10*3/uL (ref 150–400)
RBC: 3.83 MIL/uL — ABNORMAL LOW (ref 3.87–5.11)
RDW: 15.4 % (ref 11.5–15.5)
WBC: 11 10*3/uL — ABNORMAL HIGH (ref 4.0–10.5)

## 2014-02-06 LAB — HEPARIN LEVEL (UNFRACTIONATED): Heparin Unfractionated: 0.3 IU/mL (ref 0.30–0.70)

## 2014-02-06 LAB — MAGNESIUM: Magnesium: 1.2 mg/dL — ABNORMAL LOW (ref 1.5–2.5)

## 2014-02-06 LAB — POTASSIUM: Potassium: 3.8 mEq/L (ref 3.7–5.3)

## 2014-02-06 MED ORDER — HALOPERIDOL LACTATE 5 MG/ML IJ SOLN: 2.0000 mg | Freq: Four times a day (QID) | INTRAMUSCULAR | Status: DC | PRN

## 2014-02-06 MED ORDER — MAGNESIUM OXIDE 400 (241.3 MG) MG PO TABS
400.0000 mg | ORAL_TABLET | Freq: Two times a day (BID) | ORAL | Status: AC
  Administered 2014-02-06 – 2014-02-08 (×6): 400 mg via ORAL
  Filled 2014-02-06 (×7): qty 1

## 2014-02-06 MED ORDER — ALPRAZOLAM 0.25 MG PO TABS
0.2500 mg | ORAL_TABLET | Freq: Every evening | ORAL | Status: DC | PRN
  Administered 2014-02-07 – 2014-02-09 (×2): 0.25 mg via ORAL
  Filled 2014-02-06 (×3): qty 1

## 2014-02-06 MED ORDER — POTASSIUM CHLORIDE ER 10 MEQ PO TBCR
40.0000 meq | EXTENDED_RELEASE_TABLET | Freq: Once | ORAL | Status: AC
  Administered 2014-02-06: 40 meq via ORAL
  Filled 2014-02-06: qty 4

## 2014-02-06 MED ORDER — HEPARIN SODIUM (PORCINE) 5000 UNIT/ML IJ SOLN
5000.0000 [IU] | Freq: Three times a day (TID) | INTRAMUSCULAR | Status: DC
  Administered 2014-02-06 – 2014-02-10 (×13): 5000 [IU] via SUBCUTANEOUS
  Filled 2014-02-06 (×15): qty 1

## 2014-02-06 NOTE — Progress Notes (Signed)
    Subjective:  Sitting up in bed- she denies chest pain. She says she is a little SOB.  Objective:  Vital Signs in the last 24 hours: Temp:  [97.5 F (36.4 C)-98.3 F (36.8 C)] 97.5 F (36.4 C) (10/04 0400) Pulse Rate:  [34-68] 68 (10/04 0400) Resp:  [16] 16 (10/04 0400) BP: (107-124)/(60-86) 112/68 mmHg (10/04 0400) SpO2:  [99 %-100 %] 100 % (10/04 0400) Weight:  [156 lb 4.9 oz (70.9 kg)] 156 lb 4.9 oz (70.9 kg) (10/04 0400)  Intake/Output from previous day:  Intake/Output Summary (Last 24 hours) at 02/06/14 0804 Last data filed at 02/06/14 0416  Gross per 24 hour  Intake      0 ml  Output   2260 ml  Net  -2260 ml    Physical Exam: General appearance: alert, cooperative, no distress and appears somewhat confused Lungs: basilar rales Rt > Lt Heart: regular rate and rhythm   Rate: 68  Rhythm: normal sinus rhythm and frequent PVCs, short runs of NSVT  Lab Results:  Recent Labs  02/05/14 0410 02/06/14 0349  WBC 10.0 11.0*  HGB 10.1* 10.9*  PLT 209 183    Recent Labs  02/05/14 0410 02/06/14 0524  NA 146 142  K 2.8* 3.2*  CL 103 95*  CO2 31 35*  GLUCOSE 108* 115*  BUN 23 20  CREATININE 1.06 1.04    Recent Labs  02/03/14 1419 02/03/14 2000  TROPONINI 1.06* 2.08*    Recent Labs  02/03/14 2000  INR 1.32    Imaging: Imaging results have been reviewed  Cardiac Studies: Echo 02/04/14 Study Conclusions  - Left ventricle: LVEF is approximately 25% with akinesis of the mid/distal inferoseptal, distal posterior, distal anteroseptal, distal lateral, mid/distal inferior, distal anterior walls. Doppler parameters are consistent with abnormal left ventricular relaxation (grade 1 diastolic dysfunction). - Mitral valve: There was mild regurgitation. - Left atrium: The atrium was mildly dilated. - Tricuspid valve: There was moderate-severe regurgitation.    Assessment/Plan:  80 yo woman with PMH of hypertension, asthma, dyslipidemia and  hypothyroidism, lives at home with her husband, who [facility] with weakness, fatigue, and shortness of breath. She ruled in for a NSTEMI with a Troponin of 4.09. She was found to be in CHF. She was confused and apparently has some baseline dementia and she was felt to be inappropriate for cath. She is getting IV Lasix but her wgt is unchanged and her I/O is unreliable. Her BNP on 10 was still [phone].     Principal Problem:   NSTEMI (non-ST elevated myocardial infarction) Active Problems:   Acute systolic heart failure   Dementia   Hypertension   Hypothyroidism   Dyslipidemia   Prolonged QT interval    PLAN: Will place foley as we have been unable to follow her I/O on fairly high dose Lasix.  Extra K+ ordered. Check f/u K+ this pm. Consider decreasing her Lasix to 80 mg BID. Check BNP in am. Add PO Mg++. She does not appear to be a cath candidate at this time, consider stopping her IV Heparin and starting DVT prophylaxis.   Corine ShelterLuke Deseree Zemaitis PA-C [facility] [phone] 02/06/2014, 8:04 AM

## 2014-02-06 NOTE — Progress Notes (Signed)
Patient having repeat episodes of 3-4 beat runs of V-Tach. Cardiology paged and am awaiting a return phonecall from the MD on-call. Patient is sleeping peacefully, is easily aroused and denies c/o pain/discomfort. Will continue to monitor.  Sharlene Doryanya Gadiel John, RN

## 2014-02-06 NOTE — Progress Notes (Signed)
Patient c/o headache. Medicated with 650 mg of Tylenol as per MD order. Patient maintained on 1:1 sitter. Continues on telemetry and is in NSR with multifocal PVCS noted. Will continue to monitor.  Sharlene Doryanya Viliami Bracco, RN

## 2014-02-06 NOTE — Progress Notes (Signed)
Received return call from Dr. Tresa EndoKelly who verbalized understanding that patient is having repeat 3-4 beat runs of V-Tach. No new orders received. This writer put in an order for a repeat CMP and required co-sign by MD, as there was no CMP ordered with am labs.  Sharlene Doryanya Keymari Sato, RN

## 2014-02-06 NOTE — Progress Notes (Signed)
Brief Cardiology Note  Ms. [name] Contreras continues to have significant ectopy. I have given additional potassium this AM, reviewed medications and I do not find any offending agents. She is on a beta-blocker. Will write for afternoon BMP as well to keep potassium and Mg above > 4 and >2 respectively.   Leeann MustJacob Kenitha Glendinning, MD

## 2014-02-06 NOTE — Progress Notes (Addendum)
Pt. Seen and examined. Agree with the NP/PA-C note as written.  Somewhat volume overloaded, however, does not seem proportionate to her BNP. Agree with decreasing her lasix to 80 mg IV BID. Replete electrolytes. On b-blocker but continues to have ectopy, which may be ischemia-related. Not a cardiac cath candidate - will need to discuss code status again with the family. According to nursing, she has been agitated and there is no sitter today. The nursing staff do not want to use restraints. I am uncomfortable with using benzodiazepines due to her dementia and agitation. QT interval is prolonged, therefore cannot use haldol and would be careful with medications such as seroquel as well. Very few options at this point.  Spoke with daughter and provided update today, the family is considering DNR - they will be in today.  Chrystie NoseKenneth C. Kesia Dalto, MD, [facility] Attending Cardiologist [facility]

## 2014-02-06 NOTE — Progress Notes (Signed)
Patient given 40 meq of potassium x 1 PO as per MD order.   Sharlene Doryanya Murna Backer, RN

## 2014-02-07 LAB — URINE CULTURE: Colony Count: 50000

## 2014-02-07 LAB — PRO B NATRIURETIC PEPTIDE: Pro B Natriuretic peptide (BNP): 5720 pg/mL — ABNORMAL HIGH (ref 0–450)

## 2014-02-07 MED ORDER — POTASSIUM CHLORIDE CRYS ER 20 MEQ PO TBCR
20.0000 meq | EXTENDED_RELEASE_TABLET | Freq: Once | ORAL | Status: AC
  Administered 2014-02-07: 20 meq via ORAL
  Filled 2014-02-07: qty 1

## 2014-02-07 MED ORDER — POTASSIUM CHLORIDE ER 10 MEQ PO TBCR
40.0000 meq | EXTENDED_RELEASE_TABLET | Freq: Two times a day (BID) | ORAL | Status: DC
  Administered 2014-02-07 – 2014-02-10 (×7): 40 meq via ORAL
  Filled 2014-02-07 (×9): qty 4

## 2014-02-07 NOTE — Progress Notes (Signed)
Physical Therapy Treatment Patient Details Name: [name] Contreras MRN: [id number] DOB: August 26, 1925 Today's Date: 02/07/2014    History of Present Illness Pt was admitted for acute heart failure.  Rising troponins suggest NSTEMI.  Cath cancelled in favor of medical management.  She has a h/o dementia    PT Comments    Pt is in room with much company and struggles to stand and transfer with PT as compared to last note.  Pt is distracted by the visitors and has some difficulty with focus on gait and transfers.  Will continue to see and do recommend SNF as she is quite limited with safety and endurance.  Follow Up Recommendations  SNF;Supervision/Assistance - 24 hour     Equipment Recommendations  None recommended by PT    Recommendations for Other Services Other (comment)     Precautions / Restrictions Precautions Precautions: Fall Restrictions Weight Bearing Restrictions: No    Mobility  Bed Mobility Overal bed mobility: Needs Assistance Bed Mobility: Supine to Sit     Supine to sit: Mod assist     General bed mobility comments: verbalizes that her L leg is not doing right  Transfers Overall transfer level: Needs assistance Equipment used: Rolling walker (2 wheeled);None Transfers: Sit to/from [employer] Sit to Stand: Mod assist Stand pivot transfers: Mod assist       General transfer comment: Has a lot of visitors who are distracting to her.  Hand placement and seequencing cued 100%.  Ambulation/Gait Ambulation/Gait assistance: Mod assist Ambulation Distance (Feet): 30 Feet Assistive device: Rolling walker (2 wheeled);2 person hand held assist Gait Pattern/deviations: Step-through pattern;Decreased dorsiflexion - left;Decreased dorsiflexion - right;Decreased step length - left;Decreased step length - right;Ataxic;Trunk flexed;Wide base of support Gait velocity: slow Gait velocity interpretation: Below normal speed for age/gender General Gait  Details: very unsteady and leaning backward the entire time   Stairs            Wheelchair Mobility    Modified Rankin (Stroke Patients Only)       Balance Overall balance assessment: Needs assistance Sitting-balance support: Feet supported;Bilateral upper extremity supported Sitting balance-Leahy Scale: Poor   Postural control: Posterior lean Standing balance support: Bilateral upper extremity supported Standing balance-Leahy Scale: Poor Standing balance comment: plantarflexed L foot and ankle with tendency to keep more wgt on RLE and struggling to maintain upright posture                    Cognition Arousal/Alertness: Awake/alert Behavior During Therapy: Impulsive Overall Cognitive Status: History of cognitive impairments - at baseline       Memory: Decreased recall of precautions;Decreased short-term memory              Exercises      General Comments General comments (skin integrity, edema, etc.): Sitter in room with pt demonstrating confusion but nsg thinks her entire day today has been more confused      Pertinent Vitals/Pain Pain Assessment: No/denies pain BP was 96/52, pulse 113 and sat 100% per nsg notes.    Home Living                      Prior Function            PT Goals (current goals can now be found in the care plan section) Acute Rehab PT Goals Patient Stated Goal: none Progress towards PT goals: Progressing toward goals    Frequency  Min 3X/week    PT Plan  Current plan remains appropriate    Co-evaluation             End of Session Equipment Utilized During Treatment: Gait belt Activity Tolerance: Patient limited by fatigue Patient left: in chair;with call bell/phone within reach;with nursing/sitter in room;with family/visitor present     Time: [id number] PT Time Calculation (min): 25 min  Charges:  $Gait Training: 8-22 mins $Therapeutic Activity: 8-22 mins                    G Codes:       Ivar DrapeStout, Celes Dedic E 02/07/2014, 12:30 PM  Samul Dadauth Neiko Trivedi, PT MS Acute Rehab Dept. Number: [phone]

## 2014-02-07 NOTE — Progress Notes (Signed)
Utilization Review Completed Leone Mobley J. Medina Degraffenreid, RN, BSN, NCM [phone]  

## 2014-02-07 NOTE — Progress Notes (Signed)
Patient pulled out foley catheter.  No bleeding noticed.  Corine ShelterLuke Kilroy made aware.  Will continue to monitor.

## 2014-02-07 NOTE — Progress Notes (Signed)
Patient sleeping peacefully. Maintained on telemetry and is in sinus arrythmia with frequent PACS and PVCS. Will continue to monitor.  Sharlene Doryanya Kodi Steil, RN

## 2014-02-07 NOTE — Progress Notes (Signed)
    Subjective:  No SOB currently.   Objective:  Vital Signs in the last 24 hours: Temp:  [98.8 F (37.1 C)-99.6 F (37.6 C)] 98.8 F (37.1 C) (10/05 0558) Pulse Rate:  [41-113] 113 (10/05 0558) Resp:  [16-17] 16 (10/05 0558) BP: (93-109)/(61-85) 96/62 mmHg (10/05 0558) SpO2:  [98 %-100 %] 100 % (10/05 0558) Weight:  [139 lb 1.8 oz (63.1 kg)] 139 lb 1.8 oz (63.1 kg) (10/05 0558)  Intake/Output from previous day:  Intake/Output Summary (Last 24 hours) at 02/07/14 0842 Last data filed at 02/07/14 0142  Gross per 24 hour  Intake     30 ml  Output   2500 ml  Net  -2470 ml    Physical Exam: General appearance: alert, cooperative, cachectic and no distress Lungs: decreased breath sounds throughout lung fields Heart: irregularly irregular rhythm   Rate: 80  Rhythm: normal sinus rhythm and PVCs, couplets, prolonged QTc  Lab Results:  Recent Labs  02/05/14 0410 02/06/14 0349  WBC 10.0 11.0*  HGB 10.1* 10.9*  PLT 209 183    Recent Labs  02/05/14 0410 02/06/14 0524 02/06/14 1354  NA 146 142  --   K 2.8* 3.2* 3.8  CL 103 95*  --   CO2 31 35*  --   GLUCOSE 108* 115*  --   BUN 23 20  --   CREATININE 1.06 1.04  --    No results found for this basename: TROPONINI, CK, MB,  in the last 72 hours No results found for this basename: INR,  in the last 72 hours  Imaging: Imaging results have been reviewed  Cardiac Studies:  Assessment/Plan:  80 yo woman with PMH of hypertension, asthma, dyslipidemia and hypothyroidism, lives at home with her husband, [facility] with weakness, fatigue, and shortness of breath. She ruled in for a NSTEMI with a Troponin of 4.09. She was found to be in CHF. She was confused and apparently has some baseline dementia and she was felt to be inappropriate for cath. She is getting IV Lasix but her wgt is unchanged and her I/O is unreliable. Her BNP on 10/01 was still [id number].    Principal Problem:  NSTEMI (non-ST elevated myocardial infarction) Active Problems:   Acute systolic heart failure   Dementia   Hypertension   Hypothyroidism   Dyslipidemia   Prolonged QT interval   COPD with asthma    PLAN:  I/O negative 2.4L yesterday, foley placed. Continue IV Lasix one more day. Increase KDur to BID with extra 20 meq this afternoon. Dr Rennis GoldenHilty had a long conversation with family yesterday about DNR status. I think she will need SNF for rehab at discharge.   Corine ShelterLuke Kilroy PA-C Beeper [phone] 02/07/2014, 8:42 AM   I have seen and examined the patient along with Corine ShelterLuke Kilroy PA-C.  I have reviewed the chart, notes and new data.  I agree with PA's note.  Key new complaints: still dyspneic at rest Key examination changes: JVP 4-5 cm sitting upright 90 deg, S3 present Key new findings / data: good renal function, marked improvement in BNP (not sure "baseline")  PLAN: Continue diuresis another day Conservative management for suspected multivessel CAD  Thurmon FairMihai Faylinn Schwenn, MD, [facility] [phone] 02/07/2014, 9:33 AM

## 2014-02-07 NOTE — Progress Notes (Signed)
Patient's Heparin stopped at 0320. Will continue to monitor for s/s of bleeding.  Sharlene Doryanya Jamason Peckham, RN

## 2014-02-08 ENCOUNTER — Inpatient Hospital Stay (HOSPITAL_COMMUNITY): Payer: Medicare Other

## 2014-02-08 LAB — CBC
HCT: 36.6 % (ref 36.0–46.0)
Hemoglobin: 12.4 g/dL (ref 12.0–15.0)
MCH: 29 pg (ref 26.0–34.0)
MCHC: 33.9 g/dL (ref 30.0–36.0)
MCV: 85.7 fL (ref 78.0–100.0)
PLATELETS: 215 10*3/uL (ref 150–400)
RBC: 4.27 MIL/uL (ref 3.87–5.11)
RDW: 15.4 % (ref 11.5–15.5)
WBC: 15.8 10*3/uL — ABNORMAL HIGH (ref 4.0–10.5)

## 2014-02-08 LAB — BASIC METABOLIC PANEL
Anion gap: 19 — ABNORMAL HIGH (ref 5–15)
BUN: 30 mg/dL — ABNORMAL HIGH (ref 6–23)
CO2: 31 mEq/L (ref 19–32)
Calcium: 9 mg/dL (ref 8.4–10.5)
Chloride: 93 mEq/L — ABNORMAL LOW (ref 96–112)
Creatinine, Ser: 1.23 mg/dL — ABNORMAL HIGH (ref 0.50–1.10)
GFR calc Af Amer: 45 mL/min — ABNORMAL LOW (ref >90)
GFR calc non Af Amer: 39 mL/min — ABNORMAL LOW (ref >90)
Glucose, Bld: 143 mg/dL — ABNORMAL HIGH (ref 70–99)
Potassium: 4.2 mEq/L (ref 3.7–5.3)
Sodium: 143 mEq/L (ref 137–147)

## 2014-02-08 LAB — URINALYSIS, ROUTINE W REFLEX MICROSCOPIC
Bilirubin Urine: NEGATIVE
Glucose, UA: NEGATIVE mg/dL
Ketones, ur: NEGATIVE mg/dL
Nitrite: POSITIVE — AB
Protein, ur: NEGATIVE mg/dL
Specific Gravity, Urine: 1.016 (ref 1.005–1.030)
Urobilinogen, UA: 1 mg/dL (ref 0.0–1.0)
pH: 6 (ref 5.0–8.0)

## 2014-02-08 LAB — URINE MICROSCOPIC-ADD ON

## 2014-02-08 MED ORDER — MORPHINE SULFATE 2 MG/ML IJ SOLN
2.0000 mg | Freq: Once | INTRAMUSCULAR | Status: AC
  Administered 2014-02-08: 1 mg via INTRAVENOUS
  Filled 2014-02-08: qty 1

## 2014-02-08 MED ORDER — FUROSEMIDE 80 MG PO TABS
80.0000 mg | ORAL_TABLET | Freq: Two times a day (BID) | ORAL | Status: DC
  Administered 2014-02-08 – 2014-02-10 (×5): 80 mg via ORAL
  Filled 2014-02-08 (×7): qty 1

## 2014-02-08 MED ORDER — IPRATROPIUM-ALBUTEROL 0.5-2.5 (3) MG/3ML IN SOLN
3.0000 mL | Freq: Two times a day (BID) | RESPIRATORY_TRACT | Status: DC
  Administered 2014-02-08 – 2014-02-10 (×4): 3 mL via RESPIRATORY_TRACT
  Filled 2014-02-08 (×5): qty 3

## 2014-02-08 MED ORDER — ACETAMINOPHEN 325 MG PO TABS
650.0000 mg | ORAL_TABLET | ORAL | Status: DC | PRN
  Administered 2014-02-08: 650 mg via ORAL
  Filled 2014-02-08 (×3): qty 2

## 2014-02-08 MED ORDER — CEFTRIAXONE SODIUM 1 G IJ SOLR
1.0000 g | INTRAMUSCULAR | Status: DC
  Administered 2014-02-08 – 2014-02-09 (×2): 1 g via INTRAVENOUS
  Filled 2014-02-08 (×3): qty 10

## 2014-02-08 NOTE — Clinical Social Work Psychosocial (Signed)
Clinical Social Work Department BRIEF PSYCHOSOCIAL ASSESSMENT 02/08/2014  Patient:  [name] Contreras Contreras,[name] Contreras     Account Number:  [id number]     Admit date:  02/02/2014  Clinical Social Worker:  Manya SilvasROWDER,Remy Dia, LCSW  Date/Time:  02/07/2014 12:03 PM  Referred by:  Physician  Date Referred:  02/08/2014 Referred for  SNF Placement   Other Referral:   Interview type:  Patient Other interview type:    PSYCHOSOCIAL DATA Living Status:  HUSBAND Admitted from facility:   Level of care:   Primary support name:  [name] Contreras Contreras [phone] Primary support relationship to patient:  SPOUSE Degree of support available:   The husband is very supportive.    CURRENT CONCERNS Current Concerns  Post-Acute Placement   Other Concerns:   .    SOCIAL WORK ASSESSMENT / PLAN SW talked with pt. Pt was confused but happy, SW needs to come back and talk when husband in available.The pt has dementia and is not aware of where she is but the pt is awake and alert.  SW Intern attempted to reach husband but unable to leave a message as he does not have a voicemail.   Assessment/plan status:  Psychosocial Support/Ongoing Assessment of Needs Other assessment/ plan:   Information/referral to community resources:   SW needs to give husband SNF list and update him on pt needing SNF.    PATIENT'S/FAMILY'S RESPONSE TO PLAN OF CARE: PT was alert and talkative but she was confused to place and time.  . She stated that her husband was at the hopsital somewhere and that he would be back later. When SW asked about if she the pt has ever been to a SNF she started talking about the hospital stay she had one time. Patient was agreeable to talk with SW again with her husband present.  Continue attempts to reach husband.    Lockheed MartinSamantha Dollarhite, BSW Intern, [phone]    Lovette Clicheonna Shaquella Stamant, LCSW SW Intern Supervisor [phone]

## 2014-02-08 NOTE — Progress Notes (Signed)
Occupational Therapy Treatment Patient Details Name: [name] Contreras MRN: [id number] DOB: [birth year] Today's Date: 02/08/2014    History of present illness Pt was admitted for acute heart failure.  Rising troponins suggest NSTEMI.  Cath cancelled in favor of medical management.  She has a h/o dementia   OT comments  Upon entering room, patient found supine in bed with HOB raised eating. Patient's son present in room. Patient requested to finish, therapist encouraged patient to transfer to recliner to finish meal, patient willing with minimal encouragement. Patient engaged in bed mobility with mod assist and mod multimodal cues for initiation and sequencing. Patient performed stand pivot transfer with mod assist (+2 for safety). Therapist assisted with positioning patient in recliner and notified RN of patient's whereabouts, mobility status, and need for sitter? Patient left seated in recliner with son present.    Follow Up Recommendations  SNF;Supervision/Assistance - 24 hour    Equipment Recommendations   (defer to next venue of care)    Recommendations for Other Services  none at this time    Precautions / Restrictions Precautions Precautions: Fall       Mobility Bed Mobility Overal bed mobility: Needs Assistance Bed Mobility: Rolling;Supine to Sit Rolling: Min guard   Supine to sit: Mod assist     General bed mobility comments: Patient requires mod verbal cueing for initiation and sequencing and takes more than a reasonable amount of time  Transfers Overall transfer level: Needs assistance Equipment used: None Transfers: Stand Pivot Transfers Sit to Stand: Mod assist Stand pivot transfers:  (+2 for safety without AE)                ADL Overall ADL's : Needs assistance/impaired Eating/Feeding: Set up General ADL Comments: Patient engaged in bed mobility with moderate assistance and requiring mod assist for verbal cueing. Patient performed stand pivot transfer from  EOB>recliner with mod assist (+2 present for safety).                 Cognition   Behavior During Therapy: WFL for tasks assessed/performed Overall Cognitive Status: History of cognitive impairments - at baseline       Memory: Decreased recall of precautions;Decreased short-term memory                  Pertinent Vitals/ Pain       Pain Assessment: No/denies pain         Frequency Min 2X/week     Progress Toward Goals  OT Goals(current goals can now be found in the care plan section)  Progress towards OT goals: Progressing toward goals    Plan Discharge plan remains appropriate       End of Session Equipment Utilized During Treatment: Gait belt   Activity Tolerance Patient tolerated treatment well   Patient Left in chair;with call bell/phone within reach;with family/visitor present   Nurse Communication Mobility status (sitter?)     Time: [id number] OT Time Calculation (min): 10 min  Charges: OT General Charges $OT Visit: 1 Procedure OT Treatments $Self Care/Home Management : 8-22 mins  Calyssa Zobrist , MS, OTR/L, CLT 02/08/2014, 3:56 PM

## 2014-02-08 NOTE — Progress Notes (Signed)
Pt had 16 beat run of SVT. Pt asymptomatic and sitting in chair. Notified cardiology PA on call. No new orders given. Will continue to monitor pt closely.  Juliane LackLeah Janan Bogie, RN

## 2014-02-08 NOTE — Progress Notes (Signed)
Pt temp 101.1 Dr. Tenny Crawoss notified, new order for tylenol for fever, CBC, and u/a. Attempted to give pt tylenol, pt states she is unable to swallow and c/o of chest pain. Pt unable to describe symptoms to RN. States it hurts constantly like a "knot", hurts worse with breathing, feels like "burning" at times, and she states she cannot cough. Refused tylenol and pills this AM. EKG performed, VS taken temp now 98.7 axillary/99.2 oral. BP 118/78. Pt refused nitro SL, states "it will make me sick".   Dr Tenny Crawoss Paged, new orders for 2mg  morphine IVx1 and 2View chest xray.

## 2014-02-08 NOTE — Progress Notes (Signed)
SW intern spoke with husband via telephone. Husband stated he was hard of hearing and would prefer to meet with social worker at the hospital. He plans to come to his wife's room around 3:30. SW will meet husband at that time to discuss possible SNF placement.  Samanatha Dollarhite, BSW Intern, [phone] I have reviewed and agree with above note.  Lorri Frederickonna T. Andria RheinCrowder, LCSW  [phone] SW Intern Supervisor

## 2014-02-08 NOTE — Clinical Social Work Placement (Addendum)
    Clinical Social Work Department CLINICAL SOCIAL WORK PLACEMENT NOTE 02/08/2014  Patient:  [name],[name]  Account Number:  [id number] Admit date:  02/02/2014  Clinical Social Worker:  Lupita LeashNNA CROWDER, LCSW  Date/time:  02/08/2014 08:34 PM  Clinical Social Work is seeking post-discharge placement for this patient at the following level of care:   SKILLED NURSING   (*CSW will update this form in Epic as items are completed)   02/08/2014  Patient/family provided with [facility] Department of Clinical Social Work's list of facilities offering this level of care within the geographic area requested by the patient (or if unable, by the patient's family).  02/08/2014  Patient/family informed of their freedom to choose among providers that offer the needed level of care, that participate in Medicare, Medicaid or managed care program needed by the patient, have an available bed and are willing to accept the patient.  02/08/2014  Patient/family informed of [facility]' ownership interest in [facility], as well as of the fact that they are under no obligation to receive care at this facility.  PASARR submitted to EDS on 02/08/2014 PASARR number received on 02/08/2014  FL2 transmitted to all facilities in geographic area requested by pt/family on  02/08/2014 FL2 transmitted to all facilities within larger geographic area on   Patient informed that his/her managed care company has contracts with or will negotiate with  certain facilities, including the following:   [facility]     Patient/family informed of bed offers received: Yes. Son called [name] [facility]  Physician recommends and patient chooses bed at    [facility]14 Patient to be transferred to facility by Ambulance  [facility]) Patient and family notified of transfer on 02/10/14 Name of family member notified:  Son  called Wells Guiles([name])   The following physician request were entered in Epic: Physician Request  Please sign FL2.  Please prepare priority discharge summary and prescriptions.    Additional Comments: 02/10/14 Okay per MD for discharge to SNF. Patient and family notified and agreeable to discharge. Nursing notified to call report. No further social work needs. Signing off.  [name], MSW, LCSWA [phone] 02/10/2014 10:59 AM

## 2014-02-08 NOTE — Progress Notes (Signed)
Speech Language Pathology Treatment: Dysphagia  Patient Details Name: [name] Contreras Tenny MRN: [id number] DOB: 1926/03/05 Today's Date: 02/08/2014 Time: 1440-1501 SLP Time Calculation (min): 21 min  Assessment / Plan / Recommendation Clinical Impression  Observed patient eating lunch.  Pt requested soda "So I can burp and make it go down."  No overt s/s of aspiration noted at b/s.  Pt takes small bites/sips without overt difficulty noted.  Pt did have a fever, but CXR reports no pulmonary abnormality.  Lung sounds are diminished.  RN reports pills must be thoroughly crushed or they will not go down.  MD may want to consider f/u esophageal assessment.   HPI HPI: 80 [facility], confusion, dyspnea- rising troponins suggest NSTEMI.  Pt has h/o dementia, emphysema.  Medical management of current issues planned for chart review.  Bedside swallow evaluation ordered due to pt having problems gagging on solids with upper dentures in place and diet was downgraded to puree by RN due to poor pt tolerance of solids.       Pertinent Vitals    SLP Plan  Continue with current plan of care    Recommendations Diet recommendations: Dysphagia 1 (puree);Thin liquid Liquids provided via: Cup;Straw Medication Administration: Crushed with puree Supervision: Patient able to self feed Compensations: Slow rate;Small sips/bites;Check for pocketing Postural Changes and/or Swallow Maneuvers: Seated upright 90 degrees;Upright 30-60 min after meal              Oral Care Recommendations: Oral care BID Follow up Recommendations: None Plan: Continue with current plan of care    GO     Maryjo RochesterWillis, Jazlene Bares T 02/08/2014, 3:02 PM

## 2014-02-08 NOTE — Progress Notes (Signed)
Patient Name: [name] Contreras Date of Encounter: 02/08/2014  Principal Problem:   NSTEMI (non-ST elevated myocardial infarction) Active Problems:   Hypertension   Hypothyroidism   Dyslipidemia   Dementia   Acute systolic heart failure   Prolonged QT interval   COPD with asthma   Length of Stay: 6  SUBJECTIVE  Feels better this AM. Good diuresis. No further fever. WBC elevated. CXR clear. Very weak - will need SNF. Disoriented -thinks she is 80 years old.  CURRENT MEDS . amLODipine  5 mg Oral Daily  . antiseptic oral rinse  7 mL Mouth Rinse BID  . aspirin EC  81 mg Oral Daily  . atorvastatin  80 mg Oral q1800  . furosemide  80 mg Oral BID  . heparin subcutaneous  5,000 Units Subcutaneous 3 times per day  . ipratropium-albuterol  3 mL Nebulization TID  . levothyroxine  25 mcg Oral QAC breakfast  . magnesium oxide  400 mg Oral BID  . metoprolol succinate  12.5 mg Oral Daily  . potassium chloride  40 mEq Oral BID    OBJECTIVE   Intake/Output Summary (Last 24 hours) at 02/08/14 0924 Last data filed at 02/08/14 0849  Gross per 24 hour  Intake    820 ml  Output   1275 ml  Net   -455 ml   Filed Weights   02/06/14 0400 02/07/14 0558 02/08/14 0412  Weight: 70.9 kg (156 lb 4.9 oz) 63.1 kg (139 lb 1.8 oz) 63.3 kg (139 lb 8.8 oz)    PHYSICAL EXAM Filed Vitals:   02/08/14 0412 02/08/14 0503 02/08/14 0508 02/08/14 0744  BP: 113/82 118/78    Pulse: 51 67    Temp: 101.1 F (38.4 C) 99.2 F (37.3 C) 98.7 F (37.1 C)   TempSrc: Oral Oral Axillary   Resp: 18 17    Height:      Weight: 63.3 kg (139 lb 8.8 oz)     SpO2: 96% 97%  96%   General: Alert, oriented x3, no distress Head: no evidence of trauma, PERRL, EOMI, no exophtalmos or lid lag, no myxedema, no xanthelasma; normal ears, nose and oropharynx Neck: normal jugular venous pulsations and no hepatojugular reflux; brisk carotid pulses without delay and no carotid bruits Chest: clear to auscultation, no signs of  consolidation by percussion or palpation, normal fremitus, symmetrical and full respiratory excursions Cardiovascular: normal position and quality of the apical impulse, irregular rhythm, normal first and second heart sounds, no rubs or gallops, no murmur Abdomen: no tenderness or distention, no masses by palpation, no abnormal pulsatility or arterial bruits, normal bowel sounds, no hepatosplenomegaly Extremities: no clubbing, cyanosis or edema; 2+ radial, ulnar and brachial pulses bilaterally; 2+ right femoral, posterior tibial and dorsalis pedis pulses; 2+ left femoral, posterior tibial and dorsalis pedis pulses; no subclavian or femoral bruits Neurological: grossly nonfocal  LABS  CBC  Recent Labs  02/06/14 0349 02/08/14 0358  WBC 11.0* 15.8*  HGB 10.9* 12.4  HCT 32.9* 36.6  MCV 85.9 85.7  PLT 183 215   Basic Metabolic Panel  Recent Labs  02/06/14 0524 02/06/14 1354 02/08/14 0358  NA 142  --  143  K 3.2* 3.8 4.2  CL 95*  --  93*  CO2 35*  --  31  GLUCOSE 115*  --  143*  BUN 20  --  30*  CREATININE 1.04  --  1.23*  CALCIUM 8.1*  --  9.0  MG 1.2*  --   --  Liver Function Tests  Recent Labs  02/06/14 0524  AST 45*  ALT 19  ALKPHOS 112  BILITOT 0.5  PROT 7.3  ALBUMIN 3.0*    Radiology Studies Imaging results have been reviewed and No results found.  TELE NSR with frequent ectopy  ASSESSMENT AND PLAN S/P NSTEMI, severe ischemic cardiomyopathy (EF 20-25%), resolved acute HF. Now with signs of infection.  Empirical antibiotics for UTI (had Foley catheter in, self removed). Check urinalysis and culture. Switch to PO diuretics. Will need SNF. Discussed current condition and plan at length with her son, Baldo AshCarl.   Thurmon FairMihai Amarien Carne, MD, [facility] [phone] office [phone] pager 02/08/2014 9:24 AM

## 2014-02-08 NOTE — Progress Notes (Signed)
CARDIAC REHAB PHASE I   PRE:  Rate/Rhythm: 83 SR  BP:  Supine: 111/54  Sitting:   Standing:    SaO2: 97%RA  MODE:  Ambulation: 0 ft BSC  POST:  Rate/Rhythm: 101  BP:  Supine:   Sitting: 113/80  Standing:    SaO2: 96%RA 1415-1450 Came to see pt to try to walk. Pt unable to ambulate. [facility] and pt could not stand upright. Feet sliding out and she kept saying she was going to fall. [facility] Cleaned pt after she voided 350 ml. Pivoted back to be and got pt comfortable. Asst x 2. Pt is not appropriate for our services due to inability to walk.  Signing off. More appropriate for PT.   Luetta Nuttingharlene Jareli Highland, RN BSN  02/08/2014 2:47 PM

## 2014-02-08 NOTE — Progress Notes (Signed)
CSW met with patient's husband and son Fritz Pickerel this afternoon to discuss bed search process for SNF.  They are agreeable to search but have not fully agreed to allowing patient to be placed. Patient is currently cared for by her husband who uses a cane and states that he does not hear well. Patient's son acknowledges concerns about his father's ability to safely provide for her care.  Patient is alert and oriented to person- extremely pleasant and conversational.  She does not respond appropriately about questions re: placement. Active bed search in place for a SNF bed; will provide bed offers to husband and son tomorrow.  Lorie Phenix. Pauline Good, [place]

## 2014-02-09 ENCOUNTER — Other Ambulatory Visit: Payer: Self-pay | Admitting: Physician Assistant

## 2014-02-09 DIAGNOSIS — I5021 Acute systolic (congestive) heart failure: Secondary | ICD-10-CM

## 2014-02-09 MED ORDER — ATORVASTATIN CALCIUM 80 MG PO TABS: 80.0000 mg | ORAL_TABLET | Freq: Every day | ORAL | Status: AC

## 2014-02-09 MED ORDER — NITROGLYCERIN 0.4 MG SL SUBL: 0.4000 mg | SUBLINGUAL_TABLET | SUBLINGUAL | Status: AC | PRN

## 2014-02-09 MED ORDER — METOPROLOL SUCCINATE 12.5 MG HALF TABLET: 12.5000 mg | ORAL_TABLET | Freq: Every day | ORAL | Status: DC

## 2014-02-09 MED ORDER — FUROSEMIDE 80 MG PO TABS: 80.0000 mg | ORAL_TABLET | Freq: Two times a day (BID) | ORAL | Status: AC

## 2014-02-09 MED ORDER — SULFAMETHOXAZOLE-TMP DS 800-160 MG PO TABS: 1.0000 | ORAL_TABLET | Freq: Two times a day (BID) | ORAL | Status: AC

## 2014-02-09 MED ORDER — POTASSIUM CHLORIDE ER 20 MEQ PO TBCR: 40.0000 meq | EXTENDED_RELEASE_TABLET | Freq: Two times a day (BID) | ORAL | Status: AC

## 2014-02-09 NOTE — Discharge Instructions (Signed)
Acute Coronary Syndrome  Acute coronary syndrome (ACS) is an urgent problem in which the blood and oxygen supply to the heart is critically deficient. ACS requires hospitalization because one or more coronary arteries may be blocked.  ACS represents a range of conditions including:  · Previous angina that is now unstable, lasts longer, happens at rest, or is more intense.  · A heart attack, with heart muscle cell injury and death.  There are three vital coronary arteries that supply the heart muscle with blood and oxygen so that it can pump blood effectively. If blockages to these arteries develop, blood flow to the heart muscle is reduced. If the heart does not get enough blood, angina may occur as the first warning sign.  SYMPTOMS   · The most common signs of angina include:  ¨ Tightness or squeezing in the chest.  ¨ Feeling of heaviness on the chest.  ¨ Discomfort in the arms, neck, back, or jaw.  ¨ Shortness of breath and nausea.  ¨ Cold, wet skin.  · Angina is usually brought on by physical effort or excitement which increase the oxygen needs of the heart. These states increase the blood flow needs of the heart beyond what can be delivered.  · Other symptoms that are not as common include:  ¨ Fatigue  ¨ Unexplained feelings of nervousness or anxiety  ¨ Weakness  ¨ Diarrhea  · Sometimes, you may not have noticed any symptoms at all but still suffered a cardiac injury.  TREATMENT   · Medicines to help discomfort may include nitroglycerin (nitro) in the form of tablets or a spray for rapid relief, or longer-acting forms such as cream, patches, or capsules. (Be aware that there are many side effects and possible interactions with other drugs).  · Other medicines may be used to help the heart pump better.  · Procedures to open blocked arteries including angioplasty or stent placement to keep the arteries open.  · Open heart surgery may be needed when there are many blockages or they are in critical locations that  are best treated with surgery.  HOME CARE INSTRUCTIONS   · Do not use any tobacco products including cigarettes, chewing tobacco, or electronic cigarettes.  · Take one baby or adult aspirin daily, if your health care provider advises. This helps reduce the risk of a heart attack.  · It is very important that you follow the angina treatment prescribed by your health care provider. Make arrangements for proper follow-up care.  · Eat a heart healthy diet with salt and fat restrictions as advised.  · Regular exercise is good for you as long as it does not cause discomfort. Do not begin any new type of exercise until you check with your health care provider.  · If you are overweight, you should lose weight.  · Try to maintain normal blood lipid levels.  · Keep your blood pressure under control as recommended by your health care provider.  · You should tell your health care provider right away about any increase in the severity or frequency of your chest discomfort or angina attacks. When you have angina, you should stop what you are doing and sit down. This may bring relief in 3 to 5 minutes. If your health care provider has prescribed nitro, take it as directed.  · If your health care provider has given you a follow-up appointment, it is very important to keep that appointment. Not keeping the appointment could result in a chronic or   permanent injury, pain, and disability. If there is any problem keeping the appointment, you must call back to this facility for assistance.  SEEK IMMEDIATE MEDICAL CARE IF:   · You develop nausea, vomiting, or shortness of breath.  · You feel faint, lightheaded, or pass out.  · Your chest discomfort gets worse.  · You are sweating or experience sudden profound fatigue.  · You do not get relief of your chest pain after 3 doses of nitro.  · Your discomfort lasts longer than 15 minutes.  MAKE SURE YOU:   · Understand these instructions.  · Will watch your condition.  · Will get help right  away if you are not doing well or get worse.  · Take all medicines as directed by your health care provider.  Document Released: 04/22/2005 Document Revised: 04/27/2013 Document Reviewed: 08/24/2013  ExitCare® Patient Information ©2015 [employer]. This information is not intended to replace advice given to you by your health care provider. Make sure you discuss any questions you have with your health care provider.

## 2014-02-09 NOTE — Progress Notes (Signed)
Physical Therapy Treatment Patient Details Name: [name] Contreras MRN: [id number] DOB: 1925-12-05 Today's Date: 02/09/2014    History of Present Illness Pt was admitted for acute heart failure.  Rising troponins suggest NSTEMI.  Cath cancelled in favor of medical management.  She has a h/o dementia    PT Comments    Pt is demonstrating a limited tolerance for standing today, but agreed to get OOB.  Her contracture on LLE limits her standing control which fatigues her to assist.  Her O2 is removed as well as no sitter.  With no family in, she was more focused on the activity and could do more total standing with the there ex.  Will continue PT to refer to SNF.  Follow Up Recommendations  SNF;Supervision/Assistance - 24 hour     Equipment Recommendations  None recommended by PT    Recommendations for Other Services Other (comment)     Precautions / Restrictions Precautions Precautions: Fall Restrictions Weight Bearing Restrictions: No    Mobility  Bed Mobility Overal bed mobility: Needs Assistance Bed Mobility: Rolling;Supine to Sit Rolling: Min guard   Supine to sit: Min assist     General bed mobility comments: cued hand placement on railing and mod assist to scoot out to edge of bed once sitting  Transfers Overall transfer level: Needs assistance Equipment used: Rolling walker (2 wheeled);1 person hand held assist Transfers: Sit to/from [employer] Sit to Stand: Mod assist Stand pivot transfers: Mod assist       General transfer comment: Pt has repetitive cues for hand placement and does not retain the cues.  100% needed.  Ambulation/Gait Ambulation/Gait assistance: Mod assist (attempting to sit at times) Ambulation Distance (Feet): 5 Feet Assistive device: Rolling walker (2 wheeled);1 person hand held assist Gait Pattern/deviations: Shuffle;Decreased dorsiflexion - left;Decreased dorsiflexion - right;Step-to pattern;Wide base of support (tries to  sit often) Gait velocity: slow Gait velocity interpretation: Below normal speed for age/gender General Gait Details: flexing posture to sit during the gait but has willingness to try.  May be more confused than during her first PT session   Stairs            Wheelchair Mobility    Modified Rankin (Stroke Patients Only)       Balance Overall balance assessment: Needs assistance Sitting-balance support: Feet supported;Bilateral upper extremity supported Sitting balance-Leahy Scale: Fair Sitting balance - Comments: due to confusion needs to be attended continually bedside Postural control: Posterior lean Standing balance support: Bilateral upper extremity supported (mod assist support from PT with PF ankle contractures) Standing balance-Leahy Scale: Poor Standing balance comment: Continues to depend on RLE more which limits her control standing                    Cognition Arousal/Alertness: Awake/alert Behavior During Therapy: WFL for tasks assessed/performed Overall Cognitive Status: History of cognitive impairments - at baseline       Memory: Decreased recall of precautions;Decreased short-term memory              Exercises General Exercises - Lower Extremity Hip Flexion/Marching: AROM;Both;10 reps Toe Raises: Strengthening;Both;10 reps Mini-Sqauts: Strengthening;Both;10 reps    General Comments General comments (skin integrity, edema, etc.): No more sitter and no family today made pt more focused and easier to attend to gait and transfers given her level of confusion      Pertinent Vitals/Pain Pain Assessment: No/denies pain BP was 109/69, pulse 71 and O2 98% per nsg notes.    Home  Living                      Prior Function            PT Goals (current goals can now be found in the care plan section) Acute Rehab PT Goals Patient Stated Goal: none Progress towards PT goals: Progressing toward goals    Frequency  Min 3X/week     PT Plan Current plan remains appropriate    Co-evaluation             End of Session   Activity Tolerance: Patient limited by fatigue Patient left: in chair;with call bell/phone within reach;with chair alarm set     Time: 1056-1120 PT Time Calculation (min): 24 min  Charges:  $Gait Training: 8-22 mins $Therapeutic Exercise: 8-22 mins                    G Codes:      [name] DrapeStout, [name] Contreras 02/09/2014, 11:45 AM [name] Contreras, PT MS Acute Rehab Dept. Number: [phone]

## 2014-02-09 NOTE — Discharge Summary (Addendum)
Discharge Summary   Patient ID: [name] Contreras,  MRN: [id number], DOB/AGE: 1925/09/05 80 y.o.  Admit date: 02/02/2014 Discharge date: 02/10/2014  Primary Care Provider: OSEI-BONSU,GEORGE Primary Cardiologist: New - Dr. Mayford Knife  Discharge Diagnoses Principal Problem:   NSTEMI (non-ST elevated myocardial infarction)  - medical management Active Problems:   Hypertension   Hypothyroidism   Dyslipidemia   Dementia   Acute systolic heart failure  - as result of NSTEMI, EF 25% on echo   Prolonged QT interval   COPD with asthma   Allergies No Known Allergies  Procedures  Echocardiogram 02/04/2014 - Left ventricle: LVEF is approximately 25% with akinesis of the mid/distal inferoseptal, distal posterior, distal anteroseptal, distal lateral, mid/distal inferior, distal anterior walls. Doppler parameters are consistent with abnormal left ventricular relaxation (grade 1 diastolic dysfunction). - Mitral valve: There was mild regurgitation. - Left atrium: The atrium was mildly dilated. - Tricuspid valve: There was moderate-severe regurgitation.     Hospital Course  The patient is 80 year old female with past medical history significant for hypertension, asthma, dyslipidemia and hypothyroidism who presented to [facility] on 02/02/2014 with weakness, fatigue, shortness of breath, and worsening lower extremities edema over the past 2 weeks. She has been using inhaler for asthma with minimal improvement. She had a initial CT scan of the head which showed no acute intracranial abnormality. Initial EKG showed ST depression along with elevated troponin concerning for NSTEMI. She [place] 9[address]. She was subsequently transferred to [facility] for further evaluation.   By the time she presented to [facility], she was chest pain-free for the past 24-48 hours. She was placed on IV Lasix. Echocardiogram was obtained on 02/04/2014 which showed EF 25%  with akinesis of the mid/distal inferoseptal, distal posterior, distal anteroseptal, distal lateral and mid/distal inferior wall, grade 1 diastolic dysfunction, moderate to severe tricuspid regurg, mild mitral regurg. She was seen on the following day on 02/03/2014, at which time she was still very agitated and does not appear to be a candidate for cardiac catheterization from her respiratory standpoint as she could not lay flat. She was diuresed aggressively with IV diuretic. In the meantime, patient has been placed on aspirin, Lipitor, metoprolol and has been managed medically. After a long discussion between Dr Rennis Golden and family, it was eventually decided on DNR status. It was also felt that the patient is not a candidate at this time given her dementia and agitation. During her hospitalization, it was noted patient had repeated short bursts of nonsustained V. tach which was felt to be related to underlying ischemia. Electrolyte was carefully observed and repleted necessary. Patient was also noted to have a UTI with fever during her hospitalization and was started on IV Rocephin.  Patient was seen by Dr. Royann Shivers in the morning of 02/09/2014, at which time it was felt the patient's acute heart failure has resolved. She is stable for discharge to a skilled nursing facility. Given her advanced age, we were hesitant to start fluoroquinolone, we will instead give her Septra for 5 days for UTI. I'll also schedule followup for the patient as well. She will need BMET in 1 week after starting diuretic to check potassium level and renal function. Of note, she had some hypokalemia during her hospitalization due to aggressive diuresis. Patient will also need followup echocardiogram in 6 weeks to evaluate LV function, if EF continued to be low, she may be a candidate for ICD placement depend on her mental status.  Discharge Vitals Blood pressure 117/56, pulse 59, temperature 98.5 F (36.9 C), temperature source Oral,  resp. rate 18, height 5\' 5"  (1.651 m), weight 135 lb 12.9 oz (61.6 kg), SpO2 97.00%.  Filed Weights   02/08/14 0412 02/09/14 0442 02/10/14 0530  Weight: 139 lb 8.8 oz (63.3 kg) 140 lb 14 oz (63.9 kg) 135 lb 12.9 oz (61.6 kg)    Labs  CBC  Recent Labs  02/08/14 0358 02/10/14 0511  WBC 15.8* 12.3*  HGB 12.4 11.8*  HCT 36.6 35.3*  MCV 85.7 83.8  PLT 215 211   Basic Metabolic Panel  Recent Labs  02/08/14 0358 02/10/14 0511  NA 143 135*  K 4.2 4.4  CL 93* 92*  CO2 31 27  GLUCOSE 143* 94  BUN 30* 41*  CREATININE 1.23* 1.28*  CALCIUM 9.0 9.5    Disposition  Pt is being discharged to SNF today in good condition.  Follow-up Plans & Appointments      Follow-up Information   Follow up with Tereso Newcomer, PA-C On 02/18/2014. (8:30am)    Specialty:  Physician Assistant   Contact information:   [address] [place] [address] [phone]       Follow up with [facility] On 02/16/2014. (obtain BMET in 1 week during lab hour between 7:30am and 5pm)    Specialty:  Cardiology   Contact information:   [address] [place] [address] [phone]      Discharge Medications    Medication List    STOP taking these medications       amLODipine 5 MG tablet  Commonly known as:  NORVASC     simvastatin 20 MG tablet  Commonly known as:  ZOCOR      TAKE these medications       ALPRAZolam 0.5 MG tablet  Commonly known as:  XANAX  Take 0.5 mg by mouth at bedtime as needed for anxiety.     aspirin EC 81 MG tablet  Take 81 mg by mouth daily.     atorvastatin 80 MG tablet  Commonly known as:  LIPITOR  Take 1 tablet (80 mg total) by mouth daily at 6 PM.     furosemide 80 MG tablet  Commonly known as:  LASIX  Take 1 tablet (80 mg total) by mouth 2 (two) times daily.     ipratropium-albuterol 0.5-2.5 (3) MG/3ML Soln  Commonly known as:  DUONEB  Take 3 mLs by nebulization every 6 (six) hours as  needed.     levothyroxine 25 MCG tablet  Commonly known as:  SYNTHROID, LEVOTHROID  Take 25 mcg by mouth daily before breakfast.     lisinopril 10 MG tablet  Commonly known as:  PRINIVIL,ZESTRIL  Take 10 mg by mouth daily.     metoprolol succinate 25 MG 24 hr tablet  Commonly known as:  TOPROL-XL  Take 0.5 tablets (12.5 mg total) by mouth daily.     nitroGLYCERIN 0.4 MG SL tablet  Commonly known as:  NITROSTAT  Place 1 tablet (0.4 mg total) under the tongue every 5 (five) minutes x 3 doses as needed for chest pain.     Potassium Chloride ER 20 MEQ Tbcr  Take 40 mEq by mouth 2 (two) times daily.     PROAIR HFA 108 (90 BASE) MCG/ACT inhaler  Generic drug:  albuterol  Inhale 2 puffs into the lungs every 4 (four) hours as needed.     sulfamethoxazole-trimethoprim  800-160 MG per tablet  Commonly known as:  BACTRIM DS  Take 1 tablet by mouth 2 (two) times daily.        Outstanding Labs/Studies  Obtain BMET in 1 week  Duration of Discharge Encounter   Greater than 30 minutes including physician time.  Signed, Azalee CourseHao Meng, PA-C 02/09/2014, 11:38 AM _____________  Addendum:  Pt stayed in the hospital on 10/7 as son wished to visit SNF prior to pt being discharged there.  She remained medically stable overnight and will be discharged to SNF today on meds as outlined above.  Nicolasa Duckinghristopher Berge, NP 02/10/2014, 9:33 AM.

## 2014-02-09 NOTE — Progress Notes (Signed)
Patient Name: [name] Contreras Date of Encounter: 02/09/2014  Principal Problem:   NSTEMI (non-ST elevated myocardial infarction) Active Problems:   Hypertension   Hypothyroidism   Dyslipidemia   Dementia   Acute systolic heart failure   Prolonged QT interval   COPD with asthma   Length of Stay: 7  SUBJECTIVE  Had a good night. Alert and conversant, comfortable. No more fever. UO low, but probably seriously underestimated. Weight steady. No angina or dyspnea.  CURRENT MEDS . amLODipine  5 mg Oral Daily  . antiseptic oral rinse  7 mL Mouth Rinse BID  . aspirin EC  81 mg Oral Daily  . atorvastatin  80 mg Oral q1800  . cefTRIAXone (ROCEPHIN)  IV  1 g Intravenous Q24H  . furosemide  80 mg Oral BID  . heparin subcutaneous  5,000 Units Subcutaneous 3 times per day  . ipratropium-albuterol  3 mL Nebulization BID  . levothyroxine  25 mcg Oral QAC breakfast  . metoprolol succinate  12.5 mg Oral Daily  . potassium chloride  40 mEq Oral BID    OBJECTIVE   Intake/Output Summary (Last 24 hours) at 02/09/14 0840 Last data filed at 02/08/14 1449  Gross per 24 hour  Intake    410 ml  Output    350 ml  Net     60 ml   Filed Weights   02/07/14 0558 02/08/14 0412 02/09/14 0442  Weight: 63.1 kg (139 lb 1.8 oz) 63.3 kg (139 lb 8.8 oz) 63.9 kg (140 lb 14 oz)    PHYSICAL EXAM Filed Vitals:   02/08/14 1753 02/08/14 1936 02/08/14 2100 02/09/14 0442  BP: 126/83  112/57 125/54  Pulse: 88  70 72  Temp:   97.2 F (36.2 C) 97.7 F (36.5 C)  TempSrc:   Oral Oral  Resp:   18 18  Height:      Weight:    63.9 kg (140 lb 14 oz)  SpO2: 96% 99% 98% 100%   General: Alert, oriented x3, no distress  Head: no evidence of trauma, PERRL, EOMI, no exophtalmos or lid lag, no myxedema, no xanthelasma; normal ears, nose and oropharynx  Neck: normal jugular venous pulsations and no hepatojugular reflux; brisk carotid pulses without delay and no carotid bruits  Chest: clear to auscultation, no  signs of consolidation by percussion or palpation, normal fremitus, symmetrical and full respiratory excursions  Cardiovascular: normal position and quality of the apical impulse, irregular rhythm, normal first and second heart sounds, no rubs or gallops, no murmur  Abdomen: no tenderness or distention, no masses by palpation, no abnormal pulsatility or arterial bruits, normal bowel sounds, no hepatosplenomegaly  Extremities: no clubbing, cyanosis or edema; 2+ radial, ulnar and brachial pulses bilaterally; 2+ right femoral, posterior tibial and dorsalis pedis pulses; 2+ left femoral, posterior tibial and dorsalis pedis pulses; no subclavian or femoral bruits  Neurological: grossly nonfocal  LABS  CBC  Recent Labs  02/08/14 0358  WBC 15.8*  HGB 12.4  HCT 36.6  MCV 85.7  PLT 215   Basic Metabolic Panel  Recent Labs  02/06/14 1354 02/08/14 0358  NA  --  143  K 3.8 4.2  CL  --  93*  CO2  --  31  GLUCOSE  --  143*  BUN  --  30*  CREATININE  --  1.23*  CALCIUM  --  9.0    Radiology Studies Imaging results have been reviewed and Dg Chest 2 View  02/08/2014   CLINICAL DATA:  Shortness of breath, fever.  EXAM: CHEST  2 VIEW  COMPARISON:  February 02, 2014.  FINDINGS: The heart size and mediastinal contours are within normal limits. Both lungs are clear. No pneumothorax or pleural effusion is noted. The visualized skeletal structures are unremarkable.  IMPRESSION: No acute cardiopulmonary abnormality seen.   Electronically Signed   By: Roque Lias M.D.   On: 02/08/2014 11:49    TELE Frequent atrial and ventricular premature beats  ASSESSMENT AND PLAN S/P NSTEMI, severe ischemic cardiomyopathy (EF 20-25%), resolved acute HF.  UTI, nosocomial - culture pending, on antibiotics. Plan for SNF when available. Son Baldo Ash is family spokesman, but lives in [place], MD, [facility] [phone] office [phone] pager 02/09/2014 8:40 AM

## 2014-02-10 ENCOUNTER — Encounter (HOSPITAL_COMMUNITY): Payer: Self-pay | Admitting: Nurse Practitioner

## 2014-02-10 DIAGNOSIS — J449 Chronic obstructive pulmonary disease, unspecified: Secondary | ICD-10-CM

## 2014-02-10 LAB — CBC
HCT: 35.3 % — ABNORMAL LOW (ref 36.0–46.0)
Hemoglobin: 11.8 g/dL — ABNORMAL LOW (ref 12.0–15.0)
MCH: 28 pg (ref 26.0–34.0)
MCHC: 33.4 g/dL (ref 30.0–36.0)
MCV: 83.8 fL (ref 78.0–100.0)
Platelets: 211 10*3/uL (ref 150–400)
RBC: 4.21 MIL/uL (ref 3.87–5.11)
RDW: 15 % (ref 11.5–15.5)
WBC: 12.3 10*3/uL — ABNORMAL HIGH (ref 4.0–10.5)

## 2014-02-10 LAB — BASIC METABOLIC PANEL
Anion gap: 16 — ABNORMAL HIGH (ref 5–15)
BUN: 41 mg/dL — ABNORMAL HIGH (ref 6–23)
CO2: 27 mEq/L (ref 19–32)
Calcium: 9.5 mg/dL (ref 8.4–10.5)
Chloride: 92 mEq/L — ABNORMAL LOW (ref 96–112)
Creatinine, Ser: 1.28 mg/dL — ABNORMAL HIGH (ref 0.50–1.10)
GFR, EST AFRICAN AMERICAN: 43 mL/min — AB (ref >90)
GFR, EST NON AFRICAN AMERICAN: 37 mL/min — AB (ref >90)
Glucose, Bld: 94 mg/dL (ref 70–99)
POTASSIUM: 4.4 meq/L (ref 3.7–5.3)
Sodium: 135 mEq/L — ABNORMAL LOW (ref 137–147)

## 2014-02-10 MED ORDER — LISINOPRIL 10 MG PO TABS
10.0000 mg | ORAL_TABLET | Freq: Every day | ORAL | Status: DC
  Administered 2014-02-10: 10 mg via ORAL
  Filled 2014-02-10: qty 1

## 2014-02-10 MED ORDER — METOPROLOL SUCCINATE ER 25 MG PO TB24: 12.5000 mg | ORAL_TABLET | Freq: Every day | ORAL | Status: AC

## 2014-02-10 NOTE — Progress Notes (Signed)
Physical Therapy Treatment Patient Details Name: [name] Contreras MRN: [id number] DOB: 05/01/1926 Today's Date: 02/10/2014    History of Present Illness Pt was admitted for acute heart failure.  Rising troponins suggest NSTEMI.  Cath cancelled in favor of medical management.  She has a h/o dementia    PT Comments    Pt was seen for ongoing treatment of her weakness and PT is still planning SNF.  Family has last mentioned going home, and her husband is being cared for by family at present.  Pt is very limited for mobility and will remain with 3x per week plan as her care may transition home.  Follow Up Recommendations  SNF;Supervision/Assistance - 24 hour     Equipment Recommendations  None recommended by PT    Recommendations for Other Services       Precautions / Restrictions Precautions Precautions: Fall Restrictions Weight Bearing Restrictions: No    Mobility  Bed Mobility Overal bed mobility: Needs Assistance Bed Mobility: Rolling Rolling: Min assist   Supine to sit:  (declined due to cramping)     General bed mobility comments: Difficulties due to abd pain after vomiting this AM  Transfers                    Ambulation/Gait                 Stairs            Wheelchair Mobility    Modified Rankin (Stroke Patients Only)       Balance                                    Cognition Arousal/Alertness: Awake/alert Behavior During Therapy: WFL for tasks assessed/performed Overall Cognitive Status: History of cognitive impairments - at baseline       Memory: Decreased recall of precautions;Decreased short-term memory              Exercises General Exercises - Lower Extremity Ankle Circles/Pumps: AROM;Both;20 reps;Supine Short Arc Quad: AROM;Both;10 reps;Supine Heel Slides: AROM;Both;10 reps;Supine Hip ABduction/ADduction: AROM;Both;10 reps;Supine    General Comments General comments (skin integrity, edema,  etc.): Pain is her limitation today to getting OOB.  Nsg notified of her symptoms      Pertinent Vitals/Pain Pain Assessment: Faces Faces Pain Scale: Hurts even more Pain Location: ribcage and abdomen Pain Descriptors / Indicators: Cramping Pain Intervention(s): Limited activity within patient's tolerance;Patient requesting pain meds-RN notified;Repositioned;Monitored during session    Home Living                      Prior Function            PT Goals (current goals can now be found in the care plan section) Acute Rehab PT Goals Patient Stated Goal: none Progress towards PT goals: Progressing toward goals (Therapy performed although altered for abd pain)    Frequency  Min 3X/week    PT Plan Current plan remains appropriate    Co-evaluation             End of Session   Activity Tolerance: Patient limited by pain Patient left: in bed;with call bell/phone within reach;with bed alarm set     Time: 4098-1191 PT Time Calculation (min): 18 min  Charges:  $Therapeutic Exercise: 8-22 mins  G Codes:      Ivar DrapeStout, Talitha Dicarlo E 02/10/2014, 10:40 AM Samul Dadauth Sharita Bienaime, PT MS Acute Rehab Dept. Number: [phone]

## 2014-02-10 NOTE — Progress Notes (Signed)
Patient Name: [name] Contreras Date of Encounter: 02/10/2014    Principal Problem:   NSTEMI (non-ST elevated myocardial infarction) Active Problems:   Dementia   Hypertension   Acute systolic heart failure   Hypothyroidism   Dyslipidemia   Prolonged QT interval   COPD with asthma    SUBJECTIVE  Says that she had some chest pain and sob this morning.  Chest pain better after someone rubbed her chest.  Currently eating breakfast and in NAD.  CURRENT MEDS . amLODipine  5 mg Oral Daily  . antiseptic oral rinse  7 mL Mouth Rinse BID  . aspirin EC  81 mg Oral Daily  . atorvastatin  80 mg Oral q1800  . cefTRIAXone (ROCEPHIN)  IV  1 g Intravenous Q24H  . furosemide  80 mg Oral BID  . heparin subcutaneous  5,000 Units Subcutaneous 3 times per day  . ipratropium-albuterol  3 mL Nebulization BID  . levothyroxine  25 mcg Oral QAC breakfast  . metoprolol succinate  12.5 mg Oral Daily  . potassium chloride  40 mEq Oral BID    OBJECTIVE  Filed Vitals:   02/09/14 2121 02/10/14 0201 02/10/14 0530 02/10/14 0806  BP: 136/75  117/56   Pulse: 58 41 59   Temp: 98.6 F (37 C)  98.5 F (36.9 C)   TempSrc: Oral  Oral   Resp: 18 16 18    Height:      Weight:   135 lb 12.9 oz (61.6 kg)   SpO2: 98% 99% 100% 97%    Intake/Output Summary (Last 24 hours) at 02/10/14 0839 Last data filed at 02/10/14 0535  Gross per 24 hour  Intake    840 ml  Output    550 ml  Net    290 ml   Filed Weights   02/08/14 0412 02/09/14 0442 02/10/14 0530  Weight: 139 lb 8.8 oz (63.3 kg) 140 lb 14 oz (63.9 kg) 135 lb 12.9 oz (61.6 kg)    PHYSICAL EXAM  General: Pleasant, NAD. Neuro: Alert and oriented to person only. Moves all extremities spontaneously. Psych: Normal affect. HEENT:  Normal  Neck: Supple without bruits or JVD. Lungs:  Resp regular and unlabored, diminished breath sounds bilat, R more so than L. Heart: RRR no s3, s4, or murmurs. Abdomen: Soft, non-tender, non-distended, BS + x 4.    Extremities: No clubbing, cyanosis or edema. DP/PT/Radials 1+ and equal bilaterally.  Accessory Clinical Findings  CBC  Recent Labs  02/08/14 0358 02/10/14 0511  WBC 15.8* 12.3*  HGB 12.4 11.8*  HCT 36.6 35.3*  MCV 85.7 83.8  PLT 215 211   Basic Metabolic Panel  Recent Labs  02/08/14 0358 02/10/14 0511  NA 143 135*  K 4.2 4.4  CL 93* 92*  CO2 31 27  GLUCOSE 143* 94  BUN 30* 41*  CREATININE 1.23* 1.28*  CALCIUM 9.0 9.5   Lab Results  Component Value Date   TROPONINI 2.08* 02/03/2014    Lab Results  Component Value Date   CHOL 166 02/03/2014   HDL 76 02/03/2014   LDLCALC 73 02/03/2014   TRIG 83 02/03/2014   CHOLHDL 2.2 02/03/2014    TELE  Non-tele  Radiology/Studies  Dg Chest 2 View  02/08/2014   CLINICAL DATA:  Shortness of breath, fever.  EXAM: CHEST  2 VIEW  COMPARISON:  February 02, 2014.  FINDINGS: The heart size and mediastinal contours are within normal limits. Both lungs are clear. No pneumothorax or pleural effusion is noted.  The visualized skeletal structures are unremarkable.  IMPRESSION: No acute cardiopulmonary abnormality seen.   Electronically Signed   By: Roque Lias M.D.   On: 02/08/2014 11:49   ASSESSMENT AND PLAN  1. Acute systolic CHF/Presumed ICM:   EF 20-25% by echo this admission.  -4.9L for this admission.  Wt down to 135 (bedscale) from peak of 158 (10/3).  Euvolemic on exam.  HR/BP stable.  With LV dysfxn, switch metoprolol to toprol xl 25mg  daily.  BP has generally been in the 120's to 130's and creat has been in the 1-1.2 range.  She was on lisinopril @ home previously.  Would consider d/c' amlodipine and resuming lisinopril with f/u bmet @ transition of care appt next week.   2. NSTEMI: Trop peaked @ 4.09.  Cont bb, asa, statin.  Conservative mgmt.    3. HTN: stable.  Consider d/c amlodipine and resumption of acei.  4. HL: LDL 73 - she was on simva @ home.    5. Dementia/agitation: stable.  Prn benzo's while in house.  6.  Hypokalemia: resolved.  7. Atrial and Ventricular ectopy: PAC's/PAT, NSVT - now non-tele - all seemingly asymptomatic. Cont bb.  8.  UTI:  Staph sensitive to bactrim.  Currently on rocephin.  Switch to bactrim @ d/c.  9.  Dispo:  Ready for d/c to SNF.  Awaiting son's decision.  Signed, Nicolasa Ducking NP  I have seen and examined the patient along with Nicolasa Ducking NP.  I have reviewed the chart, notes and new data.  I agree with NP's note.  PLAN: Discussed our current assessment and plan of care with her son, Baldo Ash. Expressed my opinion that invasive procedures are more likely to harm than to help her. She appears to have well compensated CHF and no ongoing angina or dyspnea. UTI symptoms resolved. Ready for DC to SNF.  Thurmon Fair, MD, [facility] [phone] 02/10/2014, 9:24 AM

## 2014-02-10 NOTE — Progress Notes (Signed)
OT Cancellation Note  Patient Details Name: Demetrius CharityWillie Groh MRN: [id number] DOB: 1925/05/12   Cancelled Treatment:    Reason Eval/Treat Not Completed: Other (comment).Pt had just started eating breakfast, will see later as schedule allows.  Evette GeorgesLeonard, Clydie Dillen Eva [phone] 02/10/2014, 8:55 AM

## 2014-02-10 NOTE — Progress Notes (Signed)
Speech Language Pathology Treatment: Dysphagia  Patient Details Name: [name] Contreras MRN: [id number] DOB: 13-Feb-1926 Today's Date: 02/10/2014 Time: 5339-1792 SLP Time Calculation (min): 18 min  Assessment / Plan / Recommendation Clinical Impression  Skilled dysphagia intervention during breakfast with pleasantly confused pt.  Pt. Consumed regular texture to assess for ability to upgrade.  She continues to exhibit mildly delayed bolus prep and eggs scattered throughout oral cavity; used spontaneous liquid wash to remove majority.  Moderate verbal and visual cues given for tongue sweep to check for pocketed food.  Upper dentures not present.  Pt. Should continue Dys 1 texture and thin liquids and can upgrade gradually once home with upper denture plate donned.   HPI HPI: 80 yo female adm to [facility] with CHF, confusion, dyspnea- rising troponins suggest NSTEMI.  Pt has h/o dementia, emphysema.  Medical management of current issues planned for chart review.  Bedside swallow evaluation ordered due to pt having problems gagging on solids with upper dentures in place and diet was downgraded to puree by RN due to poor pt tolerance of solids.       Pertinent Vitals Pain Assessment: No/denies pain  SLP Plan  All goals met;Discharge SLP treatment due to (comment)    Recommendations Diet recommendations: Dysphagia 1 (puree);Thin liquid Liquids provided via: Cup;Straw Medication Administration: Crushed with puree Supervision: Patient able to self feed Compensations: Slow rate;Small sips/bites;Check for pocketing Postural Changes and/or Swallow Maneuvers: Seated upright 90 degrees;Upright 30-60 min after meal              Oral Care Recommendations: Oral care BID Follow up Recommendations: None Plan: All goals met;Discharge SLP treatment due to (comment)    GO     Houston Siren 02/10/2014, 9:56 AM

## 2014-02-10 NOTE — Plan of Care (Signed)
Problem: Phase III Progression Outcomes Goal: Discharge plan remains appropriate-arrangements made Outcome: Progressing No acute events overnight.  Patient will tentatively discharge to a skilled nursing facility when ready.  Observed decreased HR that did not sustain.  Dropped to 40s then returned to 60s-70s.  Patient is asymptomatic when this occurs.  Will continue to monitor patient condition.

## 2014-02-11 LAB — URINE CULTURE: Colony Count: >=100000

## 2014-02-15 ENCOUNTER — Encounter: Payer: Self-pay | Admitting: *Deleted

## 2014-02-16 ENCOUNTER — Other Ambulatory Visit: Payer: Medicare Other

## 2014-02-18 ENCOUNTER — Encounter: Payer: Medicare Other | Admitting: Physician Assistant

## 2014-02-18 DIAGNOSIS — I2510 Atherosclerotic heart disease of native coronary artery without angina pectoris: Secondary | ICD-10-CM | POA: Insufficient documentation

## 2014-02-18 DIAGNOSIS — I255 Ischemic cardiomyopathy: Secondary | ICD-10-CM | POA: Insufficient documentation

## 2014-02-18 NOTE — Progress Notes (Signed)
This encounter was created in error - please disregard.

## 2014-02-18 NOTE — Progress Notes (Deleted)
Cardiology Office Note   Date:  02/18/2014   ID:  [name] Contreras, DOB 1926-01-30, MRN [id number]  PCP:  Jackie PlumSEI-BONSU,GEORGE, MD  Cardiologist:  Dr. Armanda Magicraci Turner     History of Present Illness: [name] Contreras is a 80 y.o. female with a history of HTN, HL, hypothyroidism, asthma, dementia. She was admitted 9/30-10/8 with a non-STEMI. She originally presented with weakness, fatigue, shortness of breath and worsening LE edema. BNP was >34K.  She was diuresed with IV Lasix. Echocardiogram demonstrated an EF of 25% with multiple wall motion abnormalities.  Cardiac catheterization was originally planned. However, the patient had significant agitation due to dementia. Ultimately, conservative therapy was recommended with medical treatment. Patient was made DO NOT RESUSCITATE. Of note, she did have prolonged QT noted.  She also had nonsustained ventricular tachycardia on telemetry. She was treated for UTI. She was discharged to SNF. She returns for follow up.  ***  Studies:  - Echo (10/15):  EF 25%, mid/distal anteroseptal, distal posterior, distal anteroseptal, distal lateral, mid/distal inferior, distal anterior akinesis, grade 1 diastolic dysfunction, mild MR, mild LAE moderate-to-severe TR   Recent Labs/Images:  Recent Labs  02/03/14 0345  02/06/14 0524  02/07/14 0322  02/10/14 0511  NA 148*  < > 142  --   --   < > 135*  K 4.0  < > 3.2*  < >  --   < > 4.4  BUN 27*  < > 20  --   --   < > 41*  CREATININE 1.29*  < > 1.04  --   --   < > 1.28*  ALT 19  --  19  --   --   --   --   HGB 10.8*  < >  --   --   --   < > 11.8*  TSH 3.290  --   --   --   --   --   --   LDLCALC 73  --   --   --   --   --   --   HDL 76  --   --   --   --   --   --   PROBNP 33063.0*  --   --   --  5720.0*  --   --   < > = values in this interval not displayed.    Dg Chest 2 View  02/02/2014    IMPRESSION: Emphysematous changes in the lungs. No evidence of active pulmonary disease.   Electronically Signed   By:  Burman NievesWilliam  Stevens M.D.   On: 02/02/2014 23:19     Wt Readings from Last 3 Encounters:  02/10/14 135 lb 12.9 oz (61.6 kg)  02/10/14 135 lb 12.9 oz (61.6 kg)     Past Medical History  Diagnosis Date  . Asthma   . Hypertension   . Hyperlipemia   . Insomnia   . Thyroid disease   . NSTEMI (non-ST elevated myocardial infarction)     a. 02/2014 - conservatively managed 2/2 dementia/advanced age.  . Ischemic cardiomyopathy     a. 02/2014 Echo: EF 25% w/ mid/dist infespt/dist post, dist antsept, dist lat, mid/dist inf, dist ant AK. Gr 1 DD, mild MR, mildly dil LA, mod-sev TR.  Marland Kitchen. Chronic combined systolic and diastolic CHF (congestive heart failure)     a. 02/2014 Echo: EF 25%, Gr 1 DD.  Marland Kitchen. Tricuspid valve regurgitation     a. 02/2014 Echo: mod-sev TR.  Current Outpatient Prescriptions  Medication Sig Dispense Refill  . ALPRAZolam (XANAX) 0.5 MG tablet Take 0.5 mg by mouth at bedtime as needed for anxiety.      Marland Kitchen. aspirin EC 81 MG tablet Take 81 mg by mouth daily.      Marland Kitchen. atorvastatin (LIPITOR) 80 MG tablet Take 1 tablet (80 mg total) by mouth daily at 6 PM.  30 tablet  11  . furosemide (LASIX) 80 MG tablet Take 1 tablet (80 mg total) by mouth 2 (two) times daily.  60 tablet  3  . ipratropium-albuterol (DUONEB) 0.5-2.5 (3) MG/3ML SOLN Take 3 mLs by nebulization every 6 (six) hours as needed.      Marland Kitchen. levothyroxine (SYNTHROID, LEVOTHROID) 25 MCG tablet Take 25 mcg by mouth daily before breakfast.      . lisinopril (PRINIVIL,ZESTRIL) 10 MG tablet Take 10 mg by mouth daily.      . metoprolol succinate (TOPROL-XL) 25 MG 24 hr tablet Take 0.5 tablets (12.5 mg total) by mouth daily.  30 tablet  6  . nitroGLYCERIN (NITROSTAT) 0.4 MG SL tablet Place 1 tablet (0.4 mg total) under the tongue every 5 (five) minutes x 3 doses as needed for chest pain.  25 tablet  2  . potassium chloride 20 MEQ TBCR Take 40 mEq by mouth 2 (two) times daily.  60 tablet  3  . PROAIR HFA 108 (90 BASE) MCG/ACT inhaler Inhale  2 puffs into the lungs every 4 (four) hours as needed.      . sulfamethoxazole-trimethoprim (BACTRIM DS) 800-160 MG per tablet Take 1 tablet by mouth 2 (two) times daily.  10 tablet  0   No current facility-administered medications for this visit.     Allergies:   Review of patient's allergies indicates no known allergies.   Social History:  The patient  reports that she has never smoked. She does not have any smokeless tobacco history on file. She reports that she does not drink alcohol or use illicit drugs.   Family History:  The patient's family history is not on file.   ROS:  Please see the history of present illness.   ***   All other systems reviewed and negative.    PHYSICAL EXAM: VS:  There were no vitals taken for this visit. Well nourished, well developed, in no acute distress HEENT: normal Neck: ***no JVD Cardiac:  normal S1, S2; ***RRR; no murmur Lungs:  ***clear to auscultation bilaterally, no wheezing, rhonchi or rales Abd: soft, nontender, no hepatomegaly Ext: ***no edema Skin: warm and dry Neuro:  CNs 2-12 intact, no focal abnormalities noted  EKG:  ***      ASSESSMENT AND PLAN:  Coronary artery disease    Ischemic cardiomyopathy  Chronic systolic heart failure  Essential hypertension  Dyslipidemia  Dementia, with behavioral disturbance  CKD (chronic kidney disease), unspecified stage   Disposition:   FU with ***   Signed, Tereso NewcomerScott Else Habermann, PA-C, MHS 02/18/2014 8:11 AM    [facility] [address], [place], [place]  [address] Phone: [phone]; Fax: [phone]

## 2014-06-06 DEATH — deceased

## 2016-02-19 IMAGING — CR DG CHEST 2V
2 series · 2 of 2 positions shown · non-contrast
Comparison: None.

CLINICAL DATA: Shortness of breath. Increased confusion over 3
weeks. Swelling of the lower extremities.

EXAM:
CHEST  2 VIEW

[w chest pa]
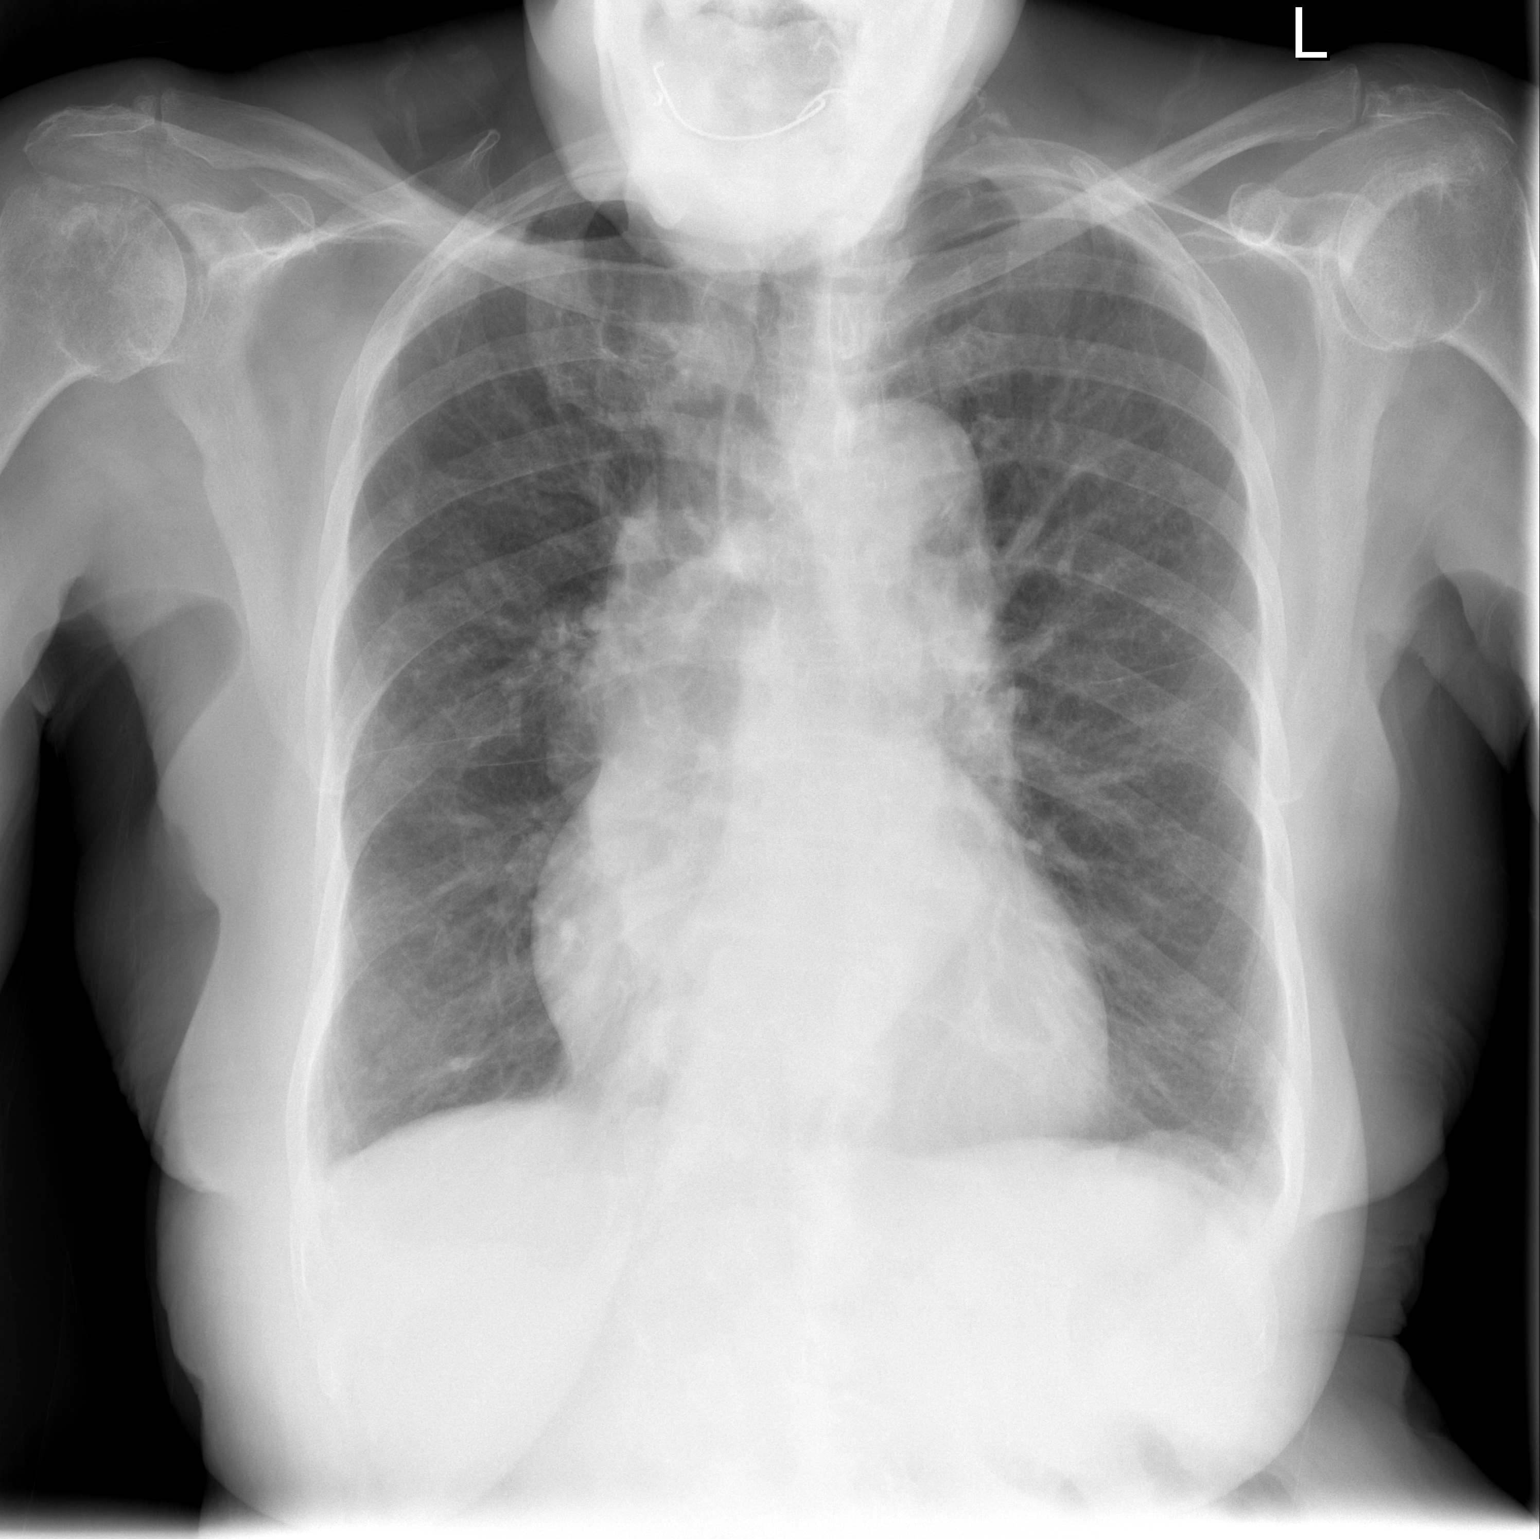

[w chest lat]
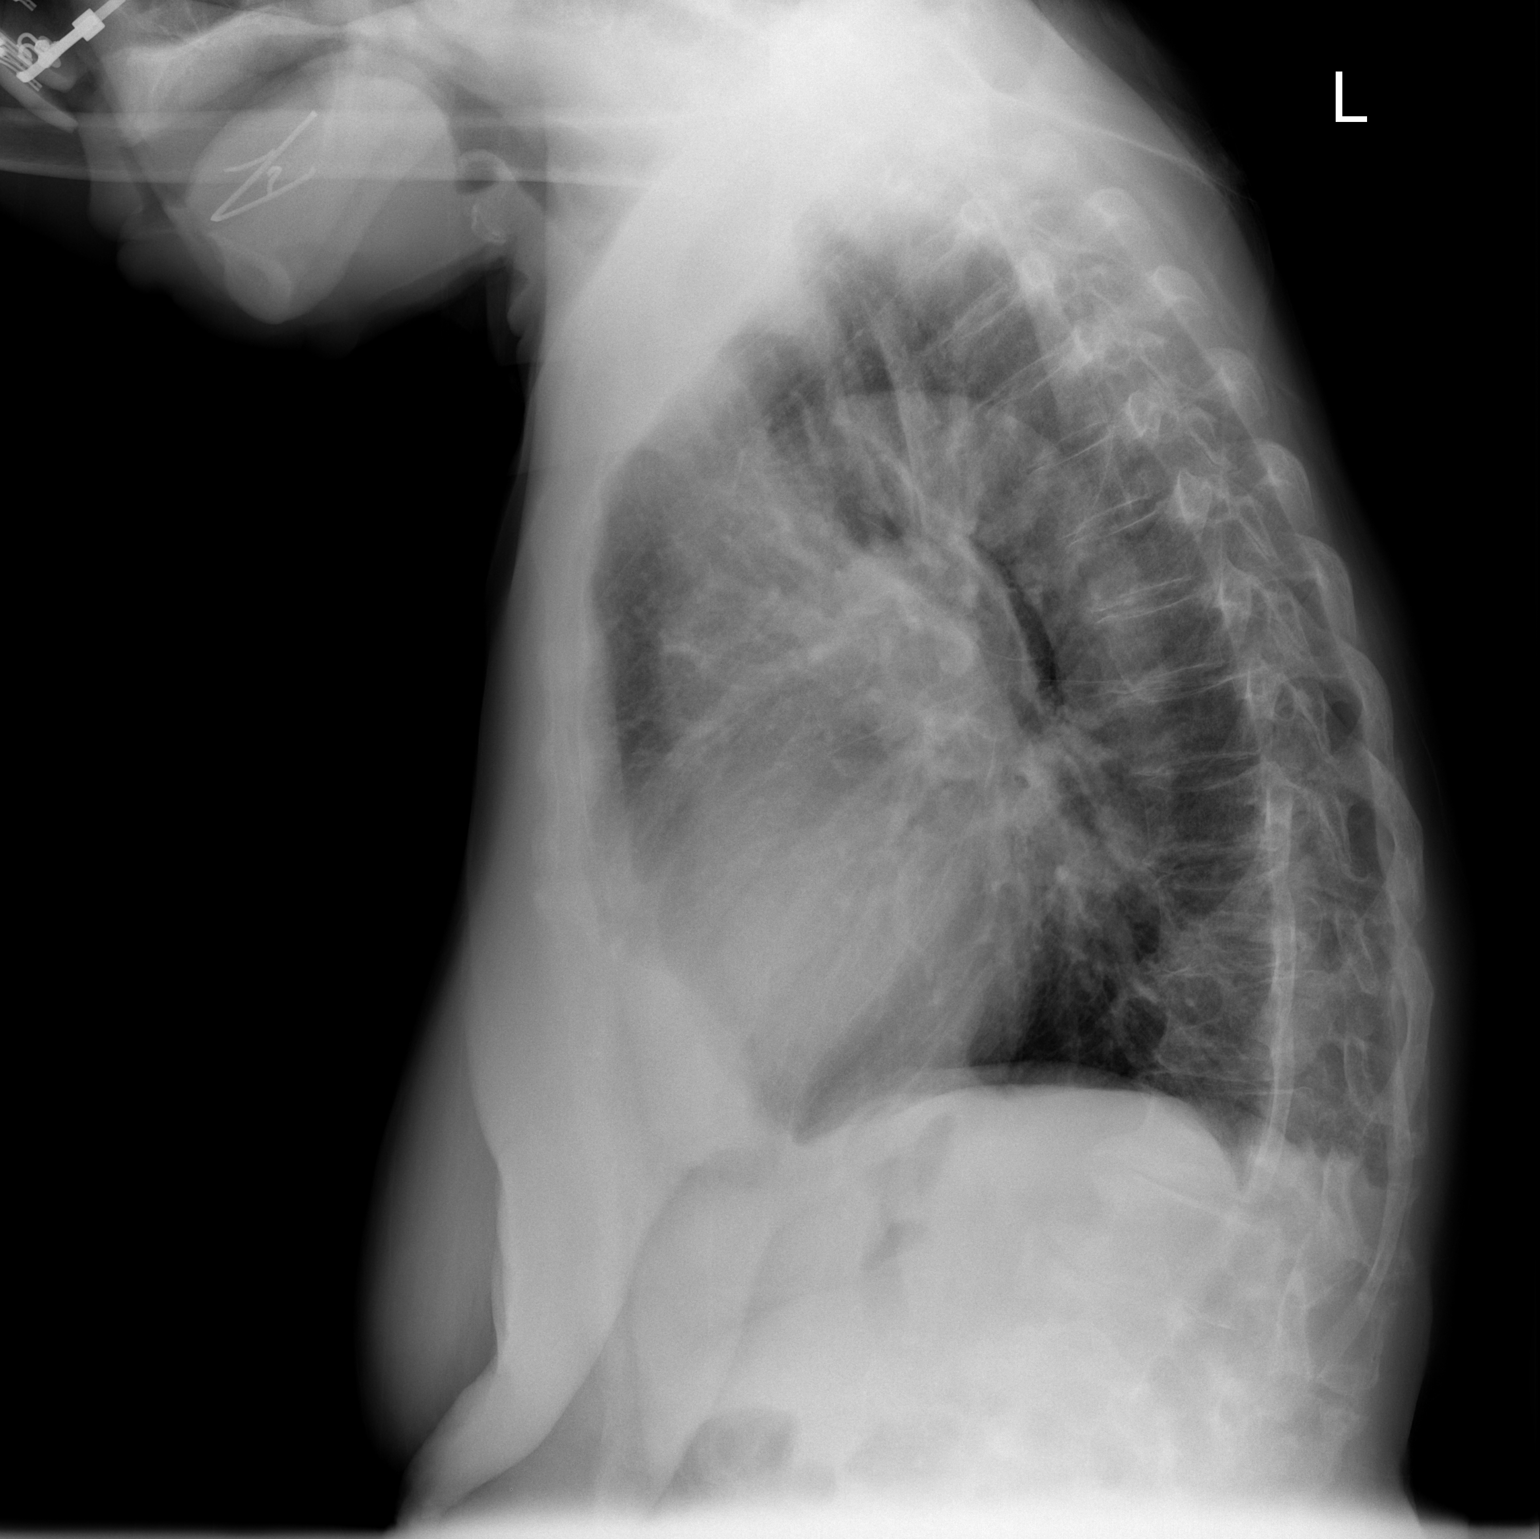

[2 of 2 positions shown; findings below may reference images not displayed]

FINDINGS: Borderline heart size with normal pulmonary vascularity. Calcified
and tortuous aorta. Hyperinflation of the lungs with scattered
fibrosis consistent with emphysematous change. No focal airspace
consolidation or edema. No blunting of costophrenic angles. No
pneumothorax. Degenerative changes in the spine and shoulders.
IMPRESSION: Emphysematous changes in the lungs. No evidence of active pulmonary
disease.

## 2016-02-25 IMAGING — CR DG CHEST 2V
1 series · 1 of 1 positions shown · non-contrast
Comparison: February 02, 2014.

CLINICAL DATA: Shortness of breath, fever.

EXAM:
CHEST  2 VIEW

[view not recorded]
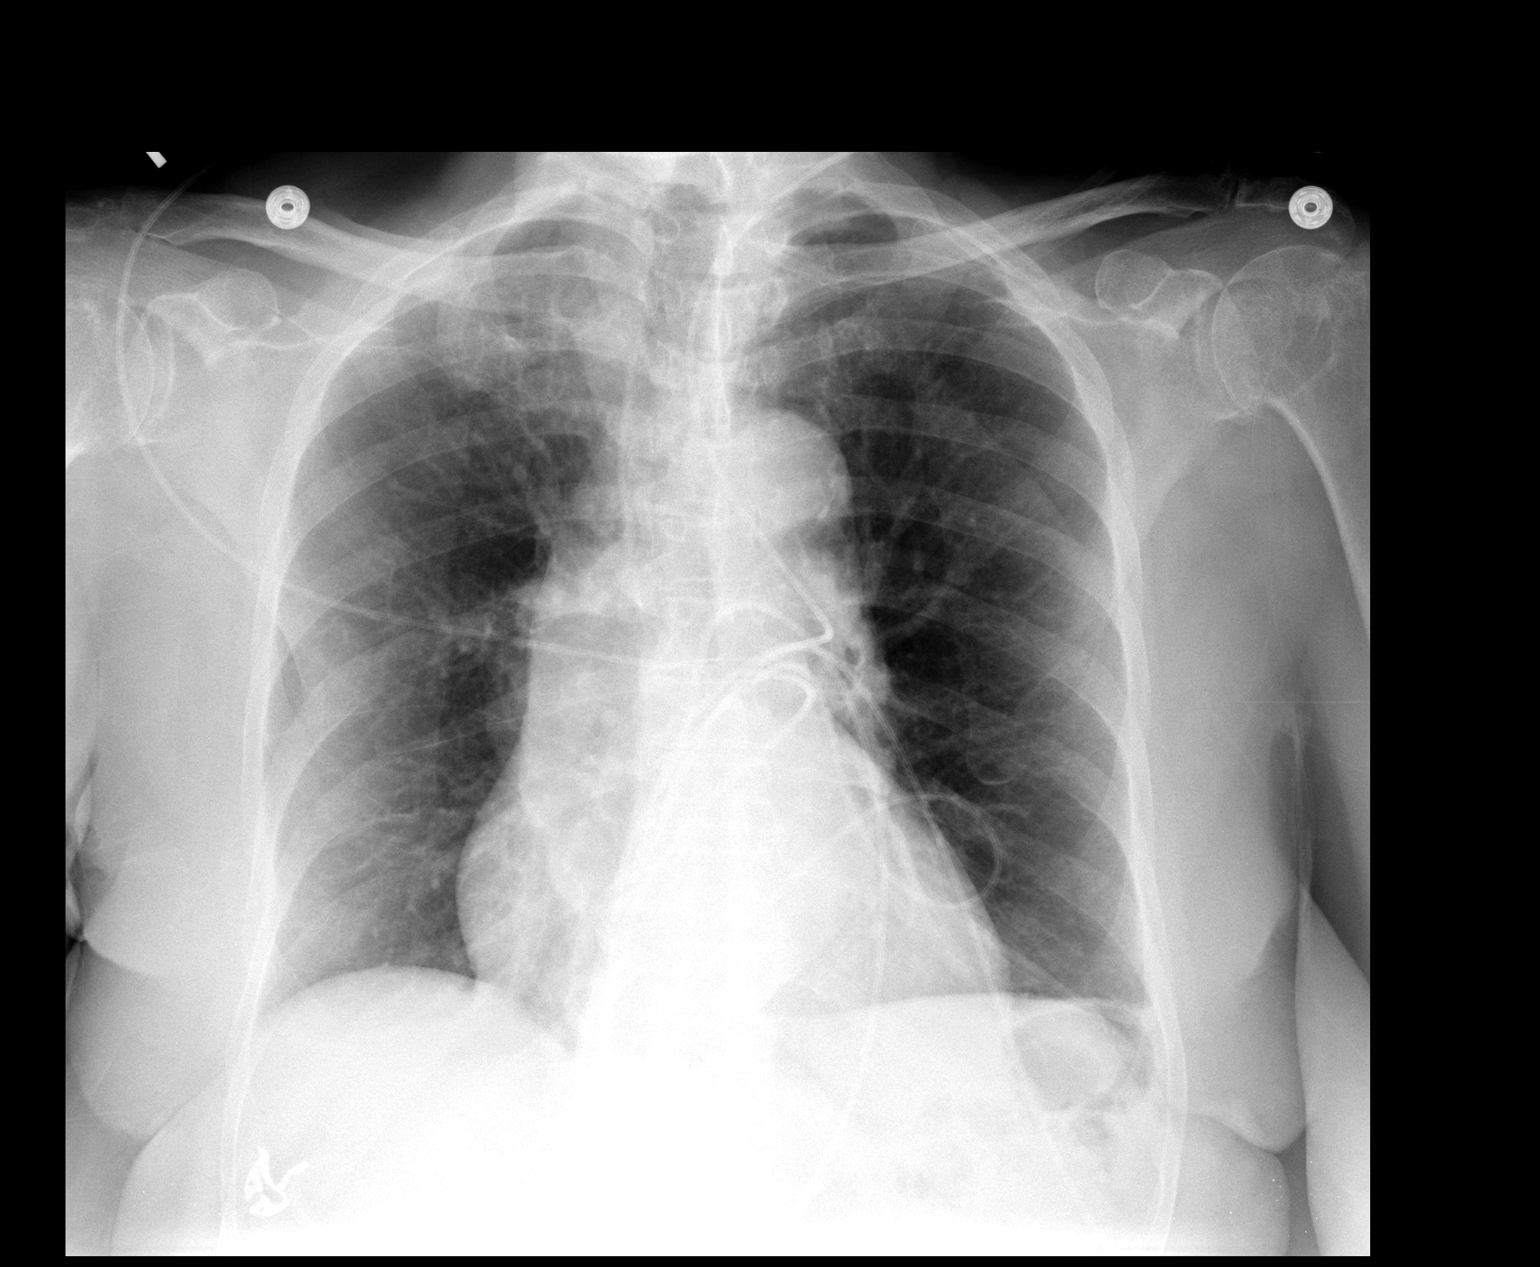

[1 of 1 positions shown; findings below may reference images not displayed]

FINDINGS: The heart size and mediastinal contours are within normal limits.
Both lungs are clear. No pneumothorax or pleural effusion is noted.
The visualized skeletal structures are unremarkable.
IMPRESSION: No acute cardiopulmonary abnormality seen.
